# Patient Record
Sex: Female | Born: 1945
Health system: Southern US, Community
[De-identification: ages and names within clinical notes are randomized; demographics above are authoritative.]

## PROBLEM LIST (undated history)

## (undated) DIAGNOSIS — E559 Vitamin D deficiency, unspecified: Secondary | ICD-10-CM

## (undated) DIAGNOSIS — C801 Malignant (primary) neoplasm, unspecified: Secondary | ICD-10-CM

## (undated) DIAGNOSIS — E785 Hyperlipidemia, unspecified: Secondary | ICD-10-CM

## (undated) DIAGNOSIS — M8580 Other specified disorders of bone density and structure, unspecified site: Secondary | ICD-10-CM

## (undated) DIAGNOSIS — K219 Gastro-esophageal reflux disease without esophagitis: Secondary | ICD-10-CM

## (undated) DIAGNOSIS — H269 Unspecified cataract: Secondary | ICD-10-CM

## (undated) DIAGNOSIS — R7989 Other specified abnormal findings of blood chemistry: Secondary | ICD-10-CM

## (undated) DIAGNOSIS — F32A Depression, unspecified: Secondary | ICD-10-CM

## (undated) DIAGNOSIS — K589 Irritable bowel syndrome without diarrhea: Secondary | ICD-10-CM

## (undated) DIAGNOSIS — F419 Anxiety disorder, unspecified: Secondary | ICD-10-CM

## (undated) DIAGNOSIS — T7840XA Allergy, unspecified, initial encounter: Secondary | ICD-10-CM

## (undated) DIAGNOSIS — Z85828 Personal history of other malignant neoplasm of skin: Secondary | ICD-10-CM

## (undated) DIAGNOSIS — M1990 Unspecified osteoarthritis, unspecified site: Secondary | ICD-10-CM

## (undated) DIAGNOSIS — R7301 Impaired fasting glucose: Secondary | ICD-10-CM

## (undated) DIAGNOSIS — F329 Major depressive disorder, single episode, unspecified: Secondary | ICD-10-CM

## (undated) DIAGNOSIS — I471 Supraventricular tachycardia, unspecified: Secondary | ICD-10-CM

## (undated) DIAGNOSIS — K635 Polyp of colon: Secondary | ICD-10-CM

## (undated) DIAGNOSIS — Z9109 Other allergy status, other than to drugs and biological substances: Secondary | ICD-10-CM

## (undated) DIAGNOSIS — R945 Abnormal results of liver function studies: Secondary | ICD-10-CM

## (undated) HISTORY — DX: Unspecified cataract: H26.9

## (undated) HISTORY — DX: Impaired fasting glucose: R73.01

## (undated) HISTORY — DX: Other specified abnormal findings of blood chemistry: R79.89

## (undated) HISTORY — DX: Hyperlipidemia, unspecified: E78.5

## (undated) HISTORY — DX: Supraventricular tachycardia, unspecified: I47.10

## (undated) HISTORY — DX: Polyp of colon: K63.5

## (undated) HISTORY — DX: Vitamin D deficiency, unspecified: E55.9

## (undated) HISTORY — DX: Gastro-esophageal reflux disease without esophagitis: K21.9

## (undated) HISTORY — DX: Depression, unspecified: F32.A

## (undated) HISTORY — DX: Other allergy status, other than to drugs and biological substances: Z91.09

## (undated) HISTORY — DX: Irritable bowel syndrome, unspecified: K58.9

## (undated) HISTORY — PX: MOHS SURGERY: SUR867

## (undated) HISTORY — DX: Personal history of other malignant neoplasm of skin: Z85.828

## (undated) HISTORY — DX: Unspecified osteoarthritis, unspecified site: M19.90

## (undated) HISTORY — DX: Anxiety disorder, unspecified: F41.9

## (undated) HISTORY — DX: Supraventricular tachycardia: I47.1

## (undated) HISTORY — DX: Allergy, unspecified, initial encounter: T78.40XA

## (undated) HISTORY — PX: DILATION AND CURETTAGE OF UTERUS: SHX78

## (undated) HISTORY — DX: Irritable bowel syndrome without diarrhea: K58.9

## (undated) HISTORY — DX: Abnormal results of liver function studies: R94.5

## (undated) HISTORY — DX: Malignant (primary) neoplasm, unspecified: C80.1

## (undated) HISTORY — DX: Other specified disorders of bone density and structure, unspecified site: M85.80

## (undated) HISTORY — DX: Major depressive disorder, single episode, unspecified: F32.9

## (undated) HISTORY — PX: OTHER SURGICAL HISTORY: SHX169

---

## 1998-08-17 ENCOUNTER — Encounter: Payer: Self-pay | Admitting: Obstetrics and Gynecology

## 1998-08-17 ENCOUNTER — Ambulatory Visit (HOSPITAL_COMMUNITY): Admission: RE | Admit: 1998-08-17 | Discharge: 1998-08-17 | Payer: Self-pay | Admitting: Obstetrics and Gynecology

## 1999-06-14 ENCOUNTER — Other Ambulatory Visit: Admission: RE | Admit: 1999-06-14 | Discharge: 1999-06-14 | Payer: Self-pay | Admitting: Obstetrics and Gynecology

## 2000-08-06 ENCOUNTER — Other Ambulatory Visit
Admission: RE | Admit: 2000-08-06 | Discharge: 2000-08-06 | Payer: Self-pay | Admitting: Physical Medicine & Rehabilitation

## 2000-12-17 HISTORY — PX: COLONOSCOPY: SHX174

## 2001-03-04 ENCOUNTER — Encounter: Admission: RE | Admit: 2001-03-04 | Discharge: 2001-03-04 | Payer: Self-pay | Admitting: Obstetrics and Gynecology

## 2001-03-04 ENCOUNTER — Encounter: Payer: Self-pay | Admitting: Obstetrics and Gynecology

## 2001-03-11 ENCOUNTER — Encounter: Payer: Self-pay | Admitting: Obstetrics and Gynecology

## 2001-03-11 ENCOUNTER — Encounter: Admission: RE | Admit: 2001-03-11 | Discharge: 2001-03-11 | Payer: Self-pay | Admitting: Obstetrics and Gynecology

## 2002-08-25 ENCOUNTER — Emergency Department (HOSPITAL_COMMUNITY): Admission: EM | Admit: 2002-08-25 | Discharge: 2002-08-25 | Payer: Self-pay | Admitting: Emergency Medicine

## 2002-10-28 ENCOUNTER — Encounter: Payer: Self-pay | Admitting: Obstetrics and Gynecology

## 2002-10-28 ENCOUNTER — Encounter: Admission: RE | Admit: 2002-10-28 | Discharge: 2002-10-28 | Payer: Self-pay | Admitting: Obstetrics and Gynecology

## 2003-09-15 ENCOUNTER — Encounter: Payer: Self-pay | Admitting: Internal Medicine

## 2003-09-15 ENCOUNTER — Encounter: Admission: RE | Admit: 2003-09-15 | Discharge: 2003-09-15 | Payer: Self-pay | Admitting: Internal Medicine

## 2004-06-07 ENCOUNTER — Encounter: Admission: RE | Admit: 2004-06-07 | Discharge: 2004-06-07 | Payer: Self-pay | Admitting: Obstetrics and Gynecology

## 2004-12-14 ENCOUNTER — Other Ambulatory Visit: Admission: RE | Admit: 2004-12-14 | Discharge: 2004-12-14 | Payer: Self-pay | Admitting: Obstetrics and Gynecology

## 2005-12-19 ENCOUNTER — Other Ambulatory Visit: Admission: RE | Admit: 2005-12-19 | Discharge: 2005-12-19 | Payer: Self-pay | Admitting: Obstetrics and Gynecology

## 2005-12-24 ENCOUNTER — Encounter: Admission: RE | Admit: 2005-12-24 | Discharge: 2005-12-24 | Payer: Self-pay | Admitting: Obstetrics and Gynecology

## 2006-07-24 ENCOUNTER — Ambulatory Visit: Payer: Self-pay | Admitting: Cardiology

## 2006-12-19 ENCOUNTER — Other Ambulatory Visit: Admission: RE | Admit: 2006-12-19 | Discharge: 2006-12-19 | Payer: Self-pay | Admitting: Obstetrics and Gynecology

## 2006-12-26 ENCOUNTER — Encounter: Admission: RE | Admit: 2006-12-26 | Discharge: 2006-12-26 | Payer: Self-pay | Admitting: Obstetrics and Gynecology

## 2007-01-23 ENCOUNTER — Ambulatory Visit: Payer: Self-pay | Admitting: Cardiology

## 2007-03-04 ENCOUNTER — Ambulatory Visit: Admission: RE | Admit: 2007-03-04 | Discharge: 2007-03-04 | Payer: Self-pay | Admitting: Gynecologic Oncology

## 2007-12-29 ENCOUNTER — Encounter: Admission: RE | Admit: 2007-12-29 | Discharge: 2007-12-29 | Payer: Self-pay | Admitting: Obstetrics and Gynecology

## 2008-02-20 ENCOUNTER — Ambulatory Visit: Payer: Self-pay | Admitting: Cardiology

## 2008-12-29 ENCOUNTER — Encounter: Admission: RE | Admit: 2008-12-29 | Discharge: 2008-12-29 | Payer: Self-pay | Admitting: Obstetrics and Gynecology

## 2009-06-27 ENCOUNTER — Other Ambulatory Visit: Admission: RE | Admit: 2009-06-27 | Discharge: 2009-06-27 | Payer: Self-pay | Admitting: Obstetrics and Gynecology

## 2010-02-10 ENCOUNTER — Encounter: Admission: RE | Admit: 2010-02-10 | Discharge: 2010-02-10 | Payer: Self-pay | Admitting: Obstetrics and Gynecology

## 2010-10-25 ENCOUNTER — Other Ambulatory Visit: Admission: RE | Admit: 2010-10-25 | Discharge: 2010-10-25 | Payer: Self-pay | Admitting: Obstetrics and Gynecology

## 2010-12-17 HISTORY — PX: MULTIPLE TOOTH EXTRACTIONS: SHX2053

## 2011-01-06 ENCOUNTER — Other Ambulatory Visit: Payer: Self-pay | Admitting: Internal Medicine

## 2011-01-06 DIAGNOSIS — Z1239 Encounter for other screening for malignant neoplasm of breast: Secondary | ICD-10-CM

## 2011-02-06 ENCOUNTER — Other Ambulatory Visit: Payer: Self-pay | Admitting: Dermatology

## 2011-02-13 ENCOUNTER — Ambulatory Visit
Admission: RE | Admit: 2011-02-13 | Discharge: 2011-02-13 | Disposition: A | Discharge status: Home / Self Care | Payer: BC Managed Care – PPO | Source: Ambulatory Visit | Attending: Internal Medicine | Admitting: Internal Medicine

## 2011-02-13 DIAGNOSIS — Z1239 Encounter for other screening for malignant neoplasm of breast: Secondary | ICD-10-CM

## 2011-05-01 NOTE — Assessment & Plan Note (Signed)
[facility]                            CARDIOLOGY OFFICE NOTE   NAME:Alicia, [name]                          MRN:          [id number]  DATE:02/20/2008                            DOB:          06-27-1946    PRIMARY CARE PHYSICIAN:  Dr. Loraine Leriche Perini  <  Jaquita Rector FOR VISIT/>  Follow up paroxysmal supraventricular tachycardia.   HISTORY OF PRESENT ILLNESS:  [name] comes back in for a 1-year visit.  She is doing very well.  She denies having any problems with  palpitations and continues on verapamil ER.  Her electrocardiogram today  shows sinus rhythm with nonspecific ST changes and a borderline short PR  interval of 132 milliseconds.  She continues to exercise doing yoga and  walking without any major limitations.   ALLERGIES:  No known drug allergies.   MEDICATIONS:  1. Verapamil SR 180 mg p.o. daily.  2. Calcium 1200 mg p.o. daily.  3. Centrum Silver daily.  4. Nexium 20 mg daily.  5. Allergy shots.  6. Crestor 5 mg daily.  7. Aspirin 81 mg every-other day.  8. Paxil 12.5 mg daily.  9. Magnesium supplements.  10.Alavert p.r.n.   REVIEW OF SYSTEMS:  As described in the history of present illness;  otherwise negative.   EXAMINATION:  Blood pressure is 120/71, heart rate is 79, weight is 160  pounds.  The patient is comfortable, normally nourished, no acute  distress.  HEENT:  Conjunctivae, lids normal.  Oropharynx is clear.  NECK:  Supple.  No elevated jugular venous pressure.  LUNGS:  Clear without labored breathing.  CARDIAC EXAM:  Reveals a regular rate and rhythm.  No S3 gallop.  EXTREMITIES:  Show no significant pitting edema.   IMPRESSION AND RECOMMENDATIONS:  Previously documented paroxysmal  supraventricular tachycardia, likely due to a reentrant mechanism.  The  patient has a borderline short PR interval.  She is symptomatically very  well controlled on her present regimen.  I suggested that she continue  regular exercise and  regular follow-up with Dr. Waynard Edwards.  If she  manifests any progressive symptoms we can certainly see her back for  further medication adjustments or perhaps for electrophysiology  referral.     Jonelle Sidle, MD  Electronically Signed    SGM/MedQ  DD: 02/20/2008  DT: 02/22/2008  Job #: [id number]   cc:   Loraine Leriche A. Perini, M.D.

## 2011-05-04 NOTE — Assessment & Plan Note (Signed)
[facility]                            CARDIOLOGY OFFICE NOTE   NAME:[name], [name]                          MRN:          [id number]  DATE:01/23/2007                            DOB:          Apr 07, 1946    PRIMARY CARE PHYSICIAN:  Dr. Rodrigo Ran.   REASON FOR VISIT:  Followup of paroxysmal supraventricular tachycardia.   HISTORY OF PRESENT ILLNESS:  I saw Alicia [name] back in August. She reports  doing well with only a brief episode of palpitations in the interim, but  nothing prolonged or more intense. She continues to do yoga and is not  having any exertional chest pain or dyspnea. Her electrocardiogram today  shows sinus rhythm with some premature ventricular complexes which she  states that she does feel at times and are described as a brief fullness  in her chest. She has had no dizziness or syncope. Her medications are  outlined below and she continues Verapamil SR. She has not had to use  any p.r.n. Lopressor.   ALLERGIES:  No known drug allergies.   MEDICATIONS:  1. Verapamil SR 180 mg daily.  2. Calcium 600 mg 2 tablets daily.  3. Centrum Silver daily.  4. Nexium 20 mg daily.  5. Allergy shots.  6. Crestor 5 mg daily.  7. Metamucil.  8. Aspirin 81 mg q.o.d.  9. Paxil 12.5 mg daily.  10.Magnesium supplements.   REVIEW OF SYSTEMS:  As described in history of present illness.   PHYSICAL EXAMINATION:  VITAL SIGNS: Blood pressure 122/80, heart rate is  70, weight 156 pounds.  GENERAL: The patient is comfortable and in no acute distress.  NECK: Reveals no elevated jugular venous pressure or loud bruits.  LUNGS: Clear without labored breathing.  CARDIAC: Reveals a regular rate and rhythm, without loud murmur or  gallop.  EXTREMITIES: Show no significant pitting edema.  SKIN: Warm and dry.   IMPRESSION/RECOMMENDATIONS:  1. History of previously documented paroxysmal supraventricular      tachycardia, likely a re-entrant mechanism  tachycardia. She likely      has a concealed pathway based on her short PR interval.      Symptomatically, she seems to be well controlled on the present      medical regimen, and at this point we will recommend observation      and yearly followup. Certainly, if      she has any progressive symptoms in the interim we can see her      back.  2. Further plan is to followup.     Jonelle Sidle, MD  Electronically Signed    SGM/MedQ  DD: 01/23/2007  DT: 01/24/2007  Job #: [id number]   cc:   Loraine Leriche A. Perini, M.D.

## 2011-05-04 NOTE — Consult Note (Signed)
NAME:  Alicia [name], Alicia [name]                   ACCOUNT NO.:  [id number]   MEDICAL RECORD NO.:  [id number]          PATIENT TYPE:  OUT   LOCATION:  GYN                          FACILITY:  [facility]   PHYSICIAN:  Alicia A. Duard Brady, MD    DATE OF BIRTH:  1946/01/16   DATE OF CONSULTATION:  03/04/2007  DATE OF DISCHARGE:                                 CONSULTATION   REFERRING PHYSICIAN:  Dr. Artist [name]   The patient is seen today in consultation at the request of Dr. Thomasena [name].  [name] is a 65 year old gravida 2, para 2 who was seen by Dr. Thomasena [name]  for routine GYN care.  At that time she had a small Hallahan area at the  tip of her clitoris that was somewhat hypopigmented.  She was told to  use Estrace cream daily and return for followup.  She was initially seen  by Dr. Thomasena [name] December 19, 2006.  Followup January 20, 2007, continued to  show very unimpressive lesion.  It was irregular but small.  She did not  feel comfortable proceeding with a biopsy and referred her to [place] today.  The patient is otherwise completely asymptomatic.  She denies any  bleeding, itching, pruritus or any other issue.  She has been menopausal  since the age of 66.  She did use vaginal Premarin cream as dictated and  did not have any significant sequelae.  She has been trying to look at  the lesion herself and has not noticed it getting any bigger and  symptomatology has not changed.  She has never had any similar symptoms  before.  She has no history of condyloma or any other STDs.  She denies  any change in bowel or bladder habits.   PAST MEDICAL HISTORY:  1. Gastritis.  2. SVT.  3. Depression.  4. Hypercholesterolemia.  5. Seasonal allergies.   MEDICATIONS:  Nexium, verapamil, Paxil, Crestor, Alavert, allergy shots.   ALLERGIES:  None.   PAST SURGICAL HISTORY:  Skin cancer which was basal cell removal in sun-  exposed areas.   SOCIAL HISTORY:  She denies use of tobacco or alcohol.   HEALTH MAINTENANCE:  She is  up-to-date on her mammograms, with her last  one being in January.  Her Pap smears are up-to-date.  She will be due  for a colonoscopy in about a year.   FAMILY HISTORY:  Her mother had lung cancer but she was a smoker.  Her  father had renal failure, most likely secondary to hypertensive  nephropathy.  Her brother has hypertension.  She has two children, ages  39 and 60.  There are alive and well.   PHYSICAL EXAMINATION:  VITAL SIGNS:  Height 5 feet 6, weight 156 pounds,  blood pressure 138/78, pulse 88, respirations 18.  GENERAL:  Well-nourished, well-developed female in no acute distress.  NECK:  Supple.  There is no lymphadenopathy, no thyromegaly.  LUNGS:  Clear to auscultation bilaterally.  CARDIOVASCULAR:  Regular rate and rhythm.  ABDOMEN:  Soft, nontender, nondistended.  There are no palpable masses  or hepatosplenomegaly.  Groins are negative for adenopathy.  EXTREMITIES:  No edema.  PELVIC:  External genitalia is somewhat atrophic.  At the very tip of  her clitoris she does have a small-wise hypopigmented lesion that  measures approximately a millimeter in size.  It is very smooth.  There  is no hyperemia.  There are no vascular changes.  There are no  rugations.  Options of biopsy versus close followup were discussed with  the patient and she wished to defer biopsy.  The remainder of the vulva  was evaluated.  There are no other visible changes.  Acetic acid was  applied to the lesion and it did not become acetowhite and it stayed its  pale [place] color.   ASSESSMENT:  A 66 year old with a very small clitoral lesion of  uncertain etiology.  I do not think that this represents a malignant  process.  However, of this is a premalignant process I still believe  close followup can ensue.  The patient is very happy with that  recommendation.  I would recommend her following up with Dr. Thomasena [name] in  6 months to ensure stability.  If there are no changes at that time then  I do  not feel that it needs any further workup or evaluation.  If,  however, it appears to be enlarging or the patient herself notices more  symptomatology she was instructed to call myself or Dr. Thomasena [name] sooner.  Her questions were elicited and answered her satisfaction.  She was very  pleased with her consultation today.  She will follow up with Dr.  Thomasena [name].      Alicia A. Duard Brady, MD  Electronically Signed     PAG/MEDQ  D:  03/04/2007  T:  03/05/2007  Job:  [id number]   cc:   Alicia [name], M.D.  Fax: [phone]   Loraine Leriche A. [name], M.D.  Fax: [phone]    Cardiology   Alicia [name], R.N.  [address]  [place], [place] [address]

## 2011-05-04 NOTE — Assessment & Plan Note (Signed)
[facility]                              CARDIOLOGY OFFICE NOTE   NAME:WHITEEdnah, Hammock                          MRN:          [id number]  DATE:07/24/2006                            DOB:          24-Apr-1946    NEW PATIENT VISIT:   PRIMARY CARE PHYSICIAN:  Mark A. Perini, M.D.   REASON FOR VISIT:  Followup supraventricular.   HISTORY OF PRESENT ILLNESS:  Ms. Skipper is a pleasant 65 year old woman with  a history of paroxysmal supraventricular tachycardia, as well as reportedly  mild hyperlipidemia.  She was seen in the office back in 2002 by Dr.  Gerri Spore following an episode of apparent supraventricular tachycardia that  was not documented.  Medical therapy/observation was recommended at that  time following a normal exercise Cardiolite demonstrating no ischemia, as  well as an echocardiogram demonstrating no ischemia, as well as an  echocardiogram demonstrating a normal left ventricular ejection fraction at  55-60% with no major valvular abnormalities.   In terms of symptom control, Ms. Pesantez states that she can think of 3 more  prolonged episodes of rapid palpitations that have occurred over the last 6  years, and otherwise she is not bothered by any particular palpitations,  except only for fleeting symptoms.  Most recently in July, [place] and [place] walking when she developed a  prolonged episode of rapid palpitations that lasted approximately 30 minutes  and ultimately required intravenous adenosine via emergency medical services  with a brief observation in the emergency department.  I do have records  from this visit, as well as telemetry strips from [facility] in [place].  The available tracings  demonstrate a narrow complex tachycardia of approximately 180-200 beats per  minute, as well as some motion artifact, and ultimately demonstration of  normal sinus  rhythm with premature ventricular complexes.  Her resting  electrocardiogram from that same time showed sinus rhythm with a PR interval  of 126 ms, nonspecific ST changes, and no frank delta waves.  The  supraventricular tachycardia was regular, and there was no clear evidence of  atrial fibrillation.  After being treated with adenosine, she was back to  baseline and had her verapamil SR dose increased from 120 mg a day to 180 mg  a day, which she continues to take at this point.  Since that time, she has  had no further symptoms with the same levels of exertion.  She states that  she is quite active and walks regularly without any angina or dyspnea on  exertion.  She does plan to go to [place] over the next few weeks for a  safari.  She has had no frank syncope associated with her symptoms.   ALLERGIES:  No known drug allergies.   PRESENT MEDICATIONS:  1.  Calcium 600 mg two tablets p.o. q.a.m. and q.h.s.  2.  Centrum Silver one p.o. daily.  3.  Paxil 12.5 mg p.o. daily.  4.  Nexium 20 mg p.o. daily.  5.  Allergy shots.  6.  Alavert as directed.  7.  Verapamil SR 180 mg p.o. daily.  8.  Crestor daily (unknown dose).  9.  Supplements of Metamucil b.i.d.  10. Aspirin 81 mg p.o. every other day.   REVIEW OF SYSTEMS:  As described in the history of present illness.  Otherwise, negative.   PHYSICAL EXAMINATION:  VITAL SIGNS:  Blood pressure today is 128/78, heart  rate is 68.  GENERAL:  The patient is normally nourished and in no acute distress.  Denying any active symptoms.  HEENT:  No exophthalmos.  Oropharynx is clear.  NECK:  Supple without elevated jugular venous pressure or loud bruits.  No  thyromegaly or thyroid tenderness is noted.  LUNGS:  Clear without labored breathing at rest.  CARDIAC:  Regular rate and rhythm without S3 gallop or murmur.  No  pericardial rub is noted.  EXTREMITIES:  No significant pitting edema.   IMPRESSION AND RECOMMENDATIONS:  1.  History  of paroxysmal supraventricular tachycardia, likely via a      reentrance mechanism such as AV nodal reentrant tachycardia.  The PR      interval is borderline short on resting electrocardiogram, although      there are no frank delta waves noted on available tracings.  She has      been tolerating verapamil for some time, and has had very few      breakthrough episodes over the last 6 years.  At this point, will plan      to continue the present dose of verapamil SR 180 mg p.o. daily.  I      discussed with her vagal maneuvers, and have also provided a      prescription for Lopressor 25 mg p.o. p.r.n. just in case she has a more      prolonged episode while she is traveling and is unable to seek medical      attention for adenosine.  Otherwise, she will come back over the next 6      months for symptom review, and sooner if she has recurrent problems.  We      did briefly touch on the possibility of ablation if needed down the      road.  2.  Further recommendations to follow.                                Jonelle Sidle, MD    SGM/MedQ  DD:  07/24/2006  DT:  07/24/2006  Job #:  [id number]   cc:   Loraine Leriche A. Perini, MD

## 2011-10-30 ENCOUNTER — Other Ambulatory Visit: Payer: Self-pay | Admitting: Obstetrics and Gynecology

## 2011-10-30 ENCOUNTER — Other Ambulatory Visit (HOSPITAL_COMMUNITY)
Admission: RE | Admit: 2011-10-30 | Discharge: 2011-10-30 | Disposition: A | Discharge status: Home / Self Care | Payer: Medicare Other | Source: Ambulatory Visit | Attending: Obstetrics and Gynecology | Admitting: Obstetrics and Gynecology

## 2011-10-30 DIAGNOSIS — Z01419 Encounter for gynecological examination (general) (routine) without abnormal findings: Secondary | ICD-10-CM | POA: Insufficient documentation

## 2011-12-24 DIAGNOSIS — J309 Allergic rhinitis, unspecified: Secondary | ICD-10-CM | POA: Diagnosis not present

## 2011-12-25 DIAGNOSIS — M949 Disorder of cartilage, unspecified: Secondary | ICD-10-CM | POA: Diagnosis not present

## 2011-12-25 DIAGNOSIS — M899 Disorder of bone, unspecified: Secondary | ICD-10-CM | POA: Diagnosis not present

## 2012-01-01 DIAGNOSIS — J309 Allergic rhinitis, unspecified: Secondary | ICD-10-CM | POA: Diagnosis not present

## 2012-01-15 DIAGNOSIS — L570 Actinic keratosis: Secondary | ICD-10-CM | POA: Diagnosis not present

## 2012-01-29 DIAGNOSIS — J309 Allergic rhinitis, unspecified: Secondary | ICD-10-CM | POA: Diagnosis not present

## 2012-02-11 DIAGNOSIS — J309 Allergic rhinitis, unspecified: Secondary | ICD-10-CM | POA: Diagnosis not present

## 2012-02-18 ENCOUNTER — Other Ambulatory Visit: Payer: Self-pay | Admitting: Internal Medicine

## 2012-02-18 DIAGNOSIS — Z1231 Encounter for screening mammogram for malignant neoplasm of breast: Secondary | ICD-10-CM

## 2012-02-21 DIAGNOSIS — J309 Allergic rhinitis, unspecified: Secondary | ICD-10-CM | POA: Diagnosis not present

## 2012-02-22 DIAGNOSIS — E785 Hyperlipidemia, unspecified: Secondary | ICD-10-CM | POA: Diagnosis not present

## 2012-02-22 DIAGNOSIS — R7301 Impaired fasting glucose: Secondary | ICD-10-CM | POA: Diagnosis not present

## 2012-02-22 DIAGNOSIS — M949 Disorder of cartilage, unspecified: Secondary | ICD-10-CM | POA: Diagnosis not present

## 2012-02-22 DIAGNOSIS — M899 Disorder of bone, unspecified: Secondary | ICD-10-CM | POA: Diagnosis not present

## 2012-02-22 DIAGNOSIS — E559 Vitamin D deficiency, unspecified: Secondary | ICD-10-CM | POA: Diagnosis not present

## 2012-02-22 DIAGNOSIS — R82998 Other abnormal findings in urine: Secondary | ICD-10-CM | POA: Diagnosis not present

## 2012-02-26 ENCOUNTER — Ambulatory Visit: Payer: Medicare Other

## 2012-02-29 ENCOUNTER — Encounter: Payer: Self-pay | Admitting: Gastroenterology

## 2012-02-29 DIAGNOSIS — Z0000 Encounter for general adult medical examination without abnormal findings: Secondary | ICD-10-CM | POA: Diagnosis not present

## 2012-02-29 DIAGNOSIS — E785 Hyperlipidemia, unspecified: Secondary | ICD-10-CM | POA: Diagnosis not present

## 2012-02-29 DIAGNOSIS — R7301 Impaired fasting glucose: Secondary | ICD-10-CM | POA: Diagnosis not present

## 2012-02-29 DIAGNOSIS — I471 Supraventricular tachycardia: Secondary | ICD-10-CM | POA: Diagnosis not present

## 2012-03-04 DIAGNOSIS — Z1212 Encounter for screening for malignant neoplasm of rectum: Secondary | ICD-10-CM | POA: Diagnosis not present

## 2012-03-06 DIAGNOSIS — J309 Allergic rhinitis, unspecified: Secondary | ICD-10-CM | POA: Diagnosis not present

## 2012-03-13 ENCOUNTER — Ambulatory Visit
Admission: RE | Admit: 2012-03-13 | Discharge: 2012-03-13 | Disposition: A | Discharge status: Home / Self Care | Payer: Medicare Other | Source: Ambulatory Visit | Attending: Internal Medicine | Admitting: Internal Medicine

## 2012-03-13 DIAGNOSIS — J309 Allergic rhinitis, unspecified: Secondary | ICD-10-CM | POA: Diagnosis not present

## 2012-03-13 DIAGNOSIS — Z1231 Encounter for screening mammogram for malignant neoplasm of breast: Secondary | ICD-10-CM

## 2012-03-27 DIAGNOSIS — J309 Allergic rhinitis, unspecified: Secondary | ICD-10-CM | POA: Diagnosis not present

## 2012-04-02 ENCOUNTER — Ambulatory Visit (AMBULATORY_SURGERY_CENTER): Payer: Medicare Other | Admitting: *Deleted

## 2012-04-02 VITALS — Ht 66.0 in | Wt 166.7 lb

## 2012-04-02 DIAGNOSIS — Z1211 Encounter for screening for malignant neoplasm of colon: Secondary | ICD-10-CM

## 2012-04-02 MED ORDER — PEG-KCL-NACL-NASULF-NA ASC-C 100 G PO SOLR: ORAL | Status: DC

## 2012-04-03 DIAGNOSIS — J309 Allergic rhinitis, unspecified: Secondary | ICD-10-CM | POA: Diagnosis not present

## 2012-04-03 DIAGNOSIS — D239 Other benign neoplasm of skin, unspecified: Secondary | ICD-10-CM | POA: Diagnosis not present

## 2012-04-03 DIAGNOSIS — Z85828 Personal history of other malignant neoplasm of skin: Secondary | ICD-10-CM | POA: Diagnosis not present

## 2012-04-03 DIAGNOSIS — L570 Actinic keratosis: Secondary | ICD-10-CM | POA: Diagnosis not present

## 2012-04-03 DIAGNOSIS — D485 Neoplasm of uncertain behavior of skin: Secondary | ICD-10-CM | POA: Diagnosis not present

## 2012-04-03 DIAGNOSIS — L821 Other seborrheic keratosis: Secondary | ICD-10-CM | POA: Diagnosis not present

## 2012-04-16 ENCOUNTER — Encounter: Payer: Self-pay | Admitting: Gastroenterology

## 2012-04-16 ENCOUNTER — Ambulatory Visit (AMBULATORY_SURGERY_CENTER): Payer: Medicare Other | Admitting: Gastroenterology

## 2012-04-16 VITALS — BP 144/88 | HR 66 | Temp 96.0°F | Resp 18 | Ht 66.0 in | Wt 166.0 lb

## 2012-04-16 DIAGNOSIS — K5730 Diverticulosis of large intestine without perforation or abscess without bleeding: Secondary | ICD-10-CM

## 2012-04-16 DIAGNOSIS — D126 Benign neoplasm of colon, unspecified: Secondary | ICD-10-CM

## 2012-04-16 DIAGNOSIS — Z1211 Encounter for screening for malignant neoplasm of colon: Secondary | ICD-10-CM | POA: Diagnosis not present

## 2012-04-16 MED ORDER — SODIUM CHLORIDE 0.9 % IV SOLN: 500.0000 mL | INTRAVENOUS | Status: DC

## 2012-04-16 NOTE — Progress Notes (Signed)
PROPOFOL PER M SMITH CRNA. SEE SCANNED INTRA PROCEDURE REPORT. EWM 

## 2012-04-16 NOTE — Op Note (Signed)
 [facility] [address] [place], [place]  [address]  COLONOSCOPY PROCEDURE REPORT  PATIENT:  Alicia, [name]  MR#:  [id number] BIRTHDATE:  Mar 21, 1946, 65 yrs. old  GENDER:  female ENDOSCOPIST:  Vania Rea. Jarold Motto, MD, [facility] REF. BY: PROCEDURE DATE:  04/16/2012 PROCEDURE:  Colonoscopy with snare polypectomy ASA CLASS:  Class II INDICATIONS:  Routine Risk Screening MEDICATIONS:   propofol (Diprivan) 250 mg IV  DESCRIPTION OF PROCEDURE:   After the risks and benefits and of the procedure were explained, informed consent was obtained. Digital rectal exam was performed and revealed perianal skin tags. The LB [id number] endoscope was introduced through the anus and advanced to the cecum, which was identified by both the appendix and ileocecal valve.  The quality of the prep was good, using MoviPrep.  The instrument was then slowly withdrawn as the colon was fully examined. <<PROCEDUREIMAGES>>  FINDINGS:  ENDOSCOPIC FINDINGS:  A sessile polyp was found in the ascending colon. 2 mm flat right colon polypsnare removed.jar #1. Two polyps were found in the descending colon. small 2-3 mm polyps also hot snare removed in left colo.  A pedunculated polyp was found in the sigmoid colon. large vascular stalked sigmoid polyp removed.jar #2///see pictures.  There were mild diverticular changes in left colon. diverticulosis was found.   Retroflexed views in the rectum revealed no abnormalities.    The scope was then withdrawn from the patient and the procedure completed.  COMPLICATIONS:  None ENDOSCOPIC IMPRESSION: 1) Sessile polyp in the ascending colon 2) Two polyps in the descending colon 3) Pedunculated polyp in the sigmoid colon 4) Diverticulosis,mild,left sided diverticulosis r/o atypia in adenomas.INCREASED RISK FOR COLON CANCER.NEEDS CLOSE F/U. RECOMMENDATIONS: 1) Await pathology results 2) Continue current medications 3) Repeat Colonoscopy in 3 years.  REPEAT EXAM:   No  ______________________________ Vania Rea. Jarold Motto, MD, [facility]  CC:  Rodrigo Ran, MD  n. Rosalie DoctorMarland Kitchen   Vania Rea. Kaedin Hicklin at 04/16/2012 09:37 AM  Clarita Crane, [id number]

## 2012-04-16 NOTE — Progress Notes (Signed)
Patient did not experience any of the following events: a burn prior to discharge; a fall within the facility; wrong site/side/patient/procedure/implant event; or a hospital transfer or hospital admission upon discharge from the facility. (G8907) Patient did not have preoperative order for IV antibiotic SSI prophylaxis. (G8918)  

## 2012-04-16 NOTE — Patient Instructions (Signed)

## 2012-04-17 ENCOUNTER — Telehealth: Payer: Self-pay | Admitting: *Deleted

## 2012-04-17 NOTE — Telephone Encounter (Signed)
  Follow up Call-  Call back number 04/16/2012  Post procedure Call Back phone  # [phone]  Permission to leave phone message Yes     Patient questions:  Do you have a fever, pain , or abdominal swelling? no Pain Score  0 *  Have you tolerated food without any problems? yes  Have you been able to return to your normal activities? yes  Do you have any questions about your discharge instructions: Diet   no Medications  no Follow up visit  no  Do you have questions or concerns about your Care? no  Actions: * If pain score is 4 or above: No action needed, pain <4.

## 2012-04-21 ENCOUNTER — Encounter: Payer: Self-pay | Admitting: Gastroenterology

## 2012-04-24 DIAGNOSIS — J309 Allergic rhinitis, unspecified: Secondary | ICD-10-CM | POA: Diagnosis not present

## 2012-04-25 DIAGNOSIS — J309 Allergic rhinitis, unspecified: Secondary | ICD-10-CM | POA: Diagnosis not present

## 2012-05-08 DIAGNOSIS — J309 Allergic rhinitis, unspecified: Secondary | ICD-10-CM | POA: Diagnosis not present

## 2012-05-15 DIAGNOSIS — J309 Allergic rhinitis, unspecified: Secondary | ICD-10-CM | POA: Diagnosis not present

## 2012-06-12 DIAGNOSIS — J309 Allergic rhinitis, unspecified: Secondary | ICD-10-CM | POA: Diagnosis not present

## 2012-07-14 DIAGNOSIS — J309 Allergic rhinitis, unspecified: Secondary | ICD-10-CM | POA: Diagnosis not present

## 2012-07-23 DIAGNOSIS — J309 Allergic rhinitis, unspecified: Secondary | ICD-10-CM | POA: Diagnosis not present

## 2012-07-24 DIAGNOSIS — J309 Allergic rhinitis, unspecified: Secondary | ICD-10-CM | POA: Diagnosis not present

## 2012-09-03 DIAGNOSIS — J309 Allergic rhinitis, unspecified: Secondary | ICD-10-CM | POA: Diagnosis not present

## 2012-09-10 DIAGNOSIS — F411 Generalized anxiety disorder: Secondary | ICD-10-CM | POA: Diagnosis not present

## 2012-09-10 DIAGNOSIS — F3342 Major depressive disorder, recurrent, in full remission: Secondary | ICD-10-CM | POA: Diagnosis not present

## 2012-09-11 DIAGNOSIS — J309 Allergic rhinitis, unspecified: Secondary | ICD-10-CM | POA: Diagnosis not present

## 2012-09-15 DIAGNOSIS — J309 Allergic rhinitis, unspecified: Secondary | ICD-10-CM | POA: Diagnosis not present

## 2012-09-22 DIAGNOSIS — J309 Allergic rhinitis, unspecified: Secondary | ICD-10-CM | POA: Diagnosis not present

## 2012-09-26 DIAGNOSIS — J309 Allergic rhinitis, unspecified: Secondary | ICD-10-CM | POA: Diagnosis not present

## 2012-10-10 DIAGNOSIS — J309 Allergic rhinitis, unspecified: Secondary | ICD-10-CM | POA: Diagnosis not present

## 2012-10-23 DIAGNOSIS — J309 Allergic rhinitis, unspecified: Secondary | ICD-10-CM | POA: Diagnosis not present

## 2012-10-31 DIAGNOSIS — Z23 Encounter for immunization: Secondary | ICD-10-CM | POA: Diagnosis not present

## 2012-11-06 DIAGNOSIS — C44319 Basal cell carcinoma of skin of other parts of face: Secondary | ICD-10-CM | POA: Diagnosis not present

## 2012-11-06 DIAGNOSIS — J309 Allergic rhinitis, unspecified: Secondary | ICD-10-CM | POA: Diagnosis not present

## 2012-11-06 DIAGNOSIS — Z85828 Personal history of other malignant neoplasm of skin: Secondary | ICD-10-CM | POA: Diagnosis not present

## 2012-11-06 DIAGNOSIS — D485 Neoplasm of uncertain behavior of skin: Secondary | ICD-10-CM | POA: Diagnosis not present

## 2012-11-06 DIAGNOSIS — L821 Other seborrheic keratosis: Secondary | ICD-10-CM | POA: Diagnosis not present

## 2012-11-06 DIAGNOSIS — D239 Other benign neoplasm of skin, unspecified: Secondary | ICD-10-CM | POA: Diagnosis not present

## 2012-11-06 DIAGNOSIS — L570 Actinic keratosis: Secondary | ICD-10-CM | POA: Diagnosis not present

## 2012-11-18 DIAGNOSIS — J3081 Allergic rhinitis due to animal (cat) (dog) hair and dander: Secondary | ICD-10-CM | POA: Diagnosis not present

## 2012-11-18 DIAGNOSIS — J309 Allergic rhinitis, unspecified: Secondary | ICD-10-CM | POA: Diagnosis not present

## 2012-11-18 DIAGNOSIS — J301 Allergic rhinitis due to pollen: Secondary | ICD-10-CM | POA: Diagnosis not present

## 2012-11-18 DIAGNOSIS — J3089 Other allergic rhinitis: Secondary | ICD-10-CM | POA: Diagnosis not present

## 2012-11-18 DIAGNOSIS — K219 Gastro-esophageal reflux disease without esophagitis: Secondary | ICD-10-CM | POA: Diagnosis not present

## 2012-11-20 DIAGNOSIS — H2510 Age-related nuclear cataract, unspecified eye: Secondary | ICD-10-CM | POA: Diagnosis not present

## 2012-11-20 DIAGNOSIS — H40019 Open angle with borderline findings, low risk, unspecified eye: Secondary | ICD-10-CM | POA: Diagnosis not present

## 2012-11-20 DIAGNOSIS — H52209 Unspecified astigmatism, unspecified eye: Secondary | ICD-10-CM | POA: Diagnosis not present

## 2012-11-20 DIAGNOSIS — H25019 Cortical age-related cataract, unspecified eye: Secondary | ICD-10-CM | POA: Diagnosis not present

## 2012-11-25 DIAGNOSIS — J309 Allergic rhinitis, unspecified: Secondary | ICD-10-CM | POA: Diagnosis not present

## 2012-12-02 DIAGNOSIS — J309 Allergic rhinitis, unspecified: Secondary | ICD-10-CM | POA: Diagnosis not present

## 2012-12-22 DIAGNOSIS — C44319 Basal cell carcinoma of skin of other parts of face: Secondary | ICD-10-CM | POA: Diagnosis not present

## 2013-01-08 DIAGNOSIS — J309 Allergic rhinitis, unspecified: Secondary | ICD-10-CM | POA: Diagnosis not present

## 2013-01-16 DIAGNOSIS — J309 Allergic rhinitis, unspecified: Secondary | ICD-10-CM | POA: Diagnosis not present

## 2013-02-11 ENCOUNTER — Other Ambulatory Visit: Payer: Self-pay

## 2013-02-24 DIAGNOSIS — J309 Allergic rhinitis, unspecified: Secondary | ICD-10-CM | POA: Diagnosis not present

## 2013-03-04 ENCOUNTER — Encounter: Payer: Self-pay | Admitting: Gastroenterology

## 2013-03-05 DIAGNOSIS — J309 Allergic rhinitis, unspecified: Secondary | ICD-10-CM | POA: Diagnosis not present

## 2013-03-06 ENCOUNTER — Encounter: Payer: Self-pay | Admitting: Gastroenterology

## 2013-03-12 DIAGNOSIS — J309 Allergic rhinitis, unspecified: Secondary | ICD-10-CM | POA: Diagnosis not present

## 2013-03-23 ENCOUNTER — Ambulatory Visit
Admission: RE | Admit: 2013-03-23 | Discharge: 2013-03-23 | Disposition: A | Discharge status: Home / Self Care | Payer: Medicare Other | Source: Ambulatory Visit

## 2013-03-23 DIAGNOSIS — Z1231 Encounter for screening mammogram for malignant neoplasm of breast: Secondary | ICD-10-CM

## 2013-03-25 DIAGNOSIS — J309 Allergic rhinitis, unspecified: Secondary | ICD-10-CM | POA: Diagnosis not present

## 2013-04-09 DIAGNOSIS — J309 Allergic rhinitis, unspecified: Secondary | ICD-10-CM | POA: Diagnosis not present

## 2013-04-16 DIAGNOSIS — J309 Allergic rhinitis, unspecified: Secondary | ICD-10-CM | POA: Diagnosis not present

## 2013-04-17 DIAGNOSIS — J309 Allergic rhinitis, unspecified: Secondary | ICD-10-CM | POA: Diagnosis not present

## 2013-05-08 DIAGNOSIS — J309 Allergic rhinitis, unspecified: Secondary | ICD-10-CM | POA: Diagnosis not present

## 2013-05-12 ENCOUNTER — Other Ambulatory Visit: Payer: Self-pay | Admitting: Dermatology

## 2013-05-12 ENCOUNTER — Ambulatory Visit (AMBULATORY_SURGERY_CENTER): Payer: Medicare Other | Admitting: *Deleted

## 2013-05-12 VITALS — Ht 66.5 in | Wt 168.6 lb

## 2013-05-12 DIAGNOSIS — L820 Inflamed seborrheic keratosis: Secondary | ICD-10-CM | POA: Diagnosis not present

## 2013-05-12 DIAGNOSIS — D485 Neoplasm of uncertain behavior of skin: Secondary | ICD-10-CM | POA: Diagnosis not present

## 2013-05-12 DIAGNOSIS — Z85828 Personal history of other malignant neoplasm of skin: Secondary | ICD-10-CM | POA: Diagnosis not present

## 2013-05-12 DIAGNOSIS — D239 Other benign neoplasm of skin, unspecified: Secondary | ICD-10-CM | POA: Diagnosis not present

## 2013-05-12 DIAGNOSIS — C44621 Squamous cell carcinoma of skin of unspecified upper limb, including shoulder: Secondary | ICD-10-CM | POA: Diagnosis not present

## 2013-05-12 DIAGNOSIS — Z1211 Encounter for screening for malignant neoplasm of colon: Secondary | ICD-10-CM

## 2013-05-12 DIAGNOSIS — E785 Hyperlipidemia, unspecified: Secondary | ICD-10-CM | POA: Diagnosis not present

## 2013-05-12 DIAGNOSIS — M949 Disorder of cartilage, unspecified: Secondary | ICD-10-CM | POA: Diagnosis not present

## 2013-05-12 DIAGNOSIS — L821 Other seborrheic keratosis: Secondary | ICD-10-CM | POA: Diagnosis not present

## 2013-05-12 DIAGNOSIS — R7301 Impaired fasting glucose: Secondary | ICD-10-CM | POA: Diagnosis not present

## 2013-05-12 DIAGNOSIS — M899 Disorder of bone, unspecified: Secondary | ICD-10-CM | POA: Diagnosis not present

## 2013-05-12 MED ORDER — MOVIPREP 100 G PO SOLR: ORAL | Status: DC

## 2013-05-14 DIAGNOSIS — F341 Dysthymic disorder: Secondary | ICD-10-CM | POA: Diagnosis not present

## 2013-05-14 DIAGNOSIS — E785 Hyperlipidemia, unspecified: Secondary | ICD-10-CM | POA: Diagnosis not present

## 2013-05-14 DIAGNOSIS — Z0000 Encounter for general adult medical examination without abnormal findings: Secondary | ICD-10-CM | POA: Diagnosis not present

## 2013-05-14 DIAGNOSIS — K219 Gastro-esophageal reflux disease without esophagitis: Secondary | ICD-10-CM | POA: Diagnosis not present

## 2013-05-14 DIAGNOSIS — Z23 Encounter for immunization: Secondary | ICD-10-CM | POA: Diagnosis not present

## 2013-05-14 DIAGNOSIS — J301 Allergic rhinitis due to pollen: Secondary | ICD-10-CM | POA: Diagnosis not present

## 2013-05-14 DIAGNOSIS — Z124 Encounter for screening for malignant neoplasm of cervix: Secondary | ICD-10-CM | POA: Diagnosis not present

## 2013-05-14 DIAGNOSIS — R7301 Impaired fasting glucose: Secondary | ICD-10-CM | POA: Diagnosis not present

## 2013-05-14 DIAGNOSIS — I471 Supraventricular tachycardia: Secondary | ICD-10-CM | POA: Diagnosis not present

## 2013-05-14 DIAGNOSIS — M949 Disorder of cartilage, unspecified: Secondary | ICD-10-CM | POA: Diagnosis not present

## 2013-05-14 DIAGNOSIS — Z1331 Encounter for screening for depression: Secondary | ICD-10-CM | POA: Diagnosis not present

## 2013-05-14 DIAGNOSIS — M899 Disorder of bone, unspecified: Secondary | ICD-10-CM | POA: Diagnosis not present

## 2013-05-18 ENCOUNTER — Encounter: Payer: Self-pay | Admitting: Gastroenterology

## 2013-05-18 ENCOUNTER — Ambulatory Visit (AMBULATORY_SURGERY_CENTER): Payer: Medicare Other | Admitting: Gastroenterology

## 2013-05-18 VITALS — BP 132/78 | HR 60 | Temp 97.8°F | Resp 12 | Ht 66.0 in | Wt 168.0 lb

## 2013-05-18 DIAGNOSIS — Z1211 Encounter for screening for malignant neoplasm of colon: Secondary | ICD-10-CM

## 2013-05-18 DIAGNOSIS — Z86010 Personal history of colonic polyps: Secondary | ICD-10-CM

## 2013-05-18 DIAGNOSIS — D126 Benign neoplasm of colon, unspecified: Secondary | ICD-10-CM

## 2013-05-18 DIAGNOSIS — K5730 Diverticulosis of large intestine without perforation or abscess without bleeding: Secondary | ICD-10-CM | POA: Diagnosis not present

## 2013-05-18 DIAGNOSIS — F959 Tic disorder, unspecified: Secondary | ICD-10-CM | POA: Diagnosis not present

## 2013-05-18 DIAGNOSIS — E785 Hyperlipidemia, unspecified: Secondary | ICD-10-CM | POA: Diagnosis not present

## 2013-05-18 MED ORDER — SODIUM CHLORIDE 0.9 % IV SOLN: 500.0000 mL | INTRAVENOUS | Status: DC

## 2013-05-18 NOTE — Op Note (Signed)
[facility] [address] [place], [address]   COLONOSCOPY PROCEDURE REPORT  PATIENT: Alicia [name], Alicia [name]  MR#: [id number] BIRTHDATE: 12-29-1945 , 66  yrs. old GENDER: Female ENDOSCOPIST: Mardella Layman, MD, [facility] REFERRED BY: PROCEDURE DATE:  05/18/2013 PROCEDURE:   Colonoscopy with snare polypectomy ASA CLASS:   Class I INDICATIONS:Patient's personal history of adenomatous colon polyps.  MEDICATIONS: propofol (Diprivan) 400mg  IV  DESCRIPTION OF PROCEDURE:   After the risks and benefits and of the procedure were explained, informed consent was obtained.  A digital rectal exam revealed no abnormalities of the rectum.    The LB [id number]  endoscope was introduced through the anus and advanced to the cecum, which was identified by both the appendix and ileocecal valve .  The quality of the prep was excellent, using MoviPrep .  The instrument was then slowly withdrawn as the colon was fully examined.     COLON FINDINGS: A polypoid shaped sessile polyp ranging between 3-40mm in size was found at the splenic flexure.  A polypectomy was performed using snare cautery.  The resection was complete and the polyp tissue was completely retrieved.   Mild diverticulosis was noted in the descending colon and sigmoid colon.     Retroflexed views revealed no abnormalities.     The scope was then withdrawn from the patient and the procedure completed.  COMPLICATIONS: There were no complications. ENDOSCOPIC IMPRESSION: 1.   Sessile polyp ranging between 3-44mm in size was found at the splenic flexure; polypectomy was performed using snare cautery 2.   Mild diverticulosis was noted in the descending colon and sigmoid colon  RECOMMENDATIONS: 1.  Repeat Colonoscopy in 3 years. 2.  Await biopsy results 3.  Continue current medications   REPEAT EXAM:  UX:LKGM Perini, MD  _______________________________ eSigned:  Mardella Layman, MD, [facility] 05/18/2013 10:26  AM

## 2013-05-18 NOTE — Progress Notes (Signed)
Patient did not experience any of the following events: a burn prior to discharge; a fall within the facility; wrong site/side/patient/procedure/implant event; or a hospital transfer or hospital admission upon discharge from the facility. (G8907) Patient did not have preoperative order for IV antibiotic SSI prophylaxis. (G8918)  

## 2013-05-18 NOTE — Patient Instructions (Addendum)
Discharge instructions given with verbal understanding. Handout on polyps and diverticulosis given. Resume previous medications. YOU HAD AN ENDOSCOPIC PROCEDURE TODAY AT THE [facility]: Refer to the procedure report that was given to you for any specific questions about what was found during the examination.  If the procedure report does not answer your questions, please call your gastroenterologist to clarify.  If you requested that your care partner not be given the details of your procedure findings, then the procedure report has been included in a sealed envelope for you to review at your convenience later.  YOU SHOULD EXPECT: Some feelings of bloating in the abdomen. Passage of more gas than usual.  Walking can help get rid of the air that was put into your GI tract during the procedure and reduce the bloating. If you had a lower endoscopy (such as a colonoscopy or flexible sigmoidoscopy) you may notice spotting of blood in your stool or on the toilet paper. If you underwent a bowel prep for your procedure, then you may not have a normal bowel movement for a few days.  DIET: Your first meal following the procedure should be a light meal and then it is ok to progress to your normal diet.  A half-sandwich or bowl of soup is an example of a good first meal.  Heavy or fried foods are harder to digest and may make you feel nauseous or bloated.  Likewise meals heavy in dairy and vegetables can cause extra gas to form and this can also increase the bloating.  Drink plenty of fluids but you should avoid alcoholic beverages for 24 hours.  ACTIVITY: Your care partner should take you home directly after the procedure.  You should plan to take it easy, moving slowly for the rest of the day.  You can resume normal activity the day after the procedure however you should NOT DRIVE or use heavy machinery for 24 hours (because of the sedation medicines used during the test).    SYMPTOMS TO REPORT  IMMEDIATELY: A gastroenterologist can be reached at any hour.  During normal business hours, 8:30 AM to 5:00 PM Monday through Friday, call [phone].  After hours and on weekends, please call the GI answering service at [phone] who will take a message and have the physician on call contact you.   Following lower endoscopy (colonoscopy or flexible sigmoidoscopy):  Excessive amounts of blood in the stool  Significant tenderness or worsening of abdominal pains  Swelling of the abdomen that is new, acute  Fever of 100F or higher  FOLLOW UP: If any biopsies were taken you will be contacted by phone or by letter within the next 1-3 weeks.  Call your gastroenterologist if you have not heard about the biopsies in 3 weeks.  Our staff will call the home number listed on your records the next business day following your procedure to check on you and address any questions or concerns that you may have at that time regarding the information given to you following your procedure. This is a courtesy call and so if there is no answer at the home number and we have not heard from you through the emergency physician on call, we will assume that you have returned to your regular daily activities without incident.  SIGNATURES/CONFIDENTIALITY: You and/or your care partner have signed paperwork which will be entered into your electronic medical record.  These signatures attest to the fact that that the information above on your After Visit   Summary has been reviewed and is understood.  Full responsibility of the confidentiality of this discharge information lies with you and/or your care-partner. 

## 2013-05-18 NOTE — Progress Notes (Signed)
Called to room to assist during endoscopic procedure.  Patient ID and intended procedure confirmed with present staff. Received instructions for my participation in the procedure from the performing physician.  

## 2013-05-19 ENCOUNTER — Telehealth: Payer: Self-pay

## 2013-05-19 DIAGNOSIS — J309 Allergic rhinitis, unspecified: Secondary | ICD-10-CM | POA: Diagnosis not present

## 2013-05-19 DIAGNOSIS — M899 Disorder of bone, unspecified: Secondary | ICD-10-CM | POA: Diagnosis not present

## 2013-05-19 NOTE — Telephone Encounter (Signed)
  Follow up Call-  Call back number 05/18/2013 04/16/2012  Post procedure Call Back phone  # [phone]  Permission to leave phone message Yes Yes     Patient questions:  Do you have a fever, pain , or abdominal swelling? no Pain Score  0 *  Have you tolerated food without any problems? yes  Have you been able to return to your normal activities? yes  Do you have any questions about your discharge instructions: Diet   no Medications  no Follow up visit  no  Do you have questions or concerns about your Care? no  Actions: * If pain score is 4 or above: No action needed, pain <4.

## 2013-05-25 ENCOUNTER — Encounter: Payer: Self-pay | Admitting: Gastroenterology

## 2013-05-25 DIAGNOSIS — J309 Allergic rhinitis, unspecified: Secondary | ICD-10-CM | POA: Diagnosis not present

## 2013-05-28 DIAGNOSIS — J309 Allergic rhinitis, unspecified: Secondary | ICD-10-CM | POA: Diagnosis not present

## 2013-06-11 DIAGNOSIS — J309 Allergic rhinitis, unspecified: Secondary | ICD-10-CM | POA: Diagnosis not present

## 2013-06-12 ENCOUNTER — Other Ambulatory Visit: Payer: Self-pay | Admitting: Dermatology

## 2013-06-12 DIAGNOSIS — C44621 Squamous cell carcinoma of skin of unspecified upper limb, including shoulder: Secondary | ICD-10-CM | POA: Diagnosis not present

## 2013-06-17 DIAGNOSIS — J309 Allergic rhinitis, unspecified: Secondary | ICD-10-CM | POA: Diagnosis not present

## 2013-06-23 DIAGNOSIS — J309 Allergic rhinitis, unspecified: Secondary | ICD-10-CM | POA: Diagnosis not present

## 2013-07-08 DIAGNOSIS — J309 Allergic rhinitis, unspecified: Secondary | ICD-10-CM | POA: Diagnosis not present

## 2013-08-18 DIAGNOSIS — J309 Allergic rhinitis, unspecified: Secondary | ICD-10-CM | POA: Diagnosis not present

## 2013-08-21 DIAGNOSIS — J309 Allergic rhinitis, unspecified: Secondary | ICD-10-CM | POA: Diagnosis not present

## 2013-09-01 DIAGNOSIS — J309 Allergic rhinitis, unspecified: Secondary | ICD-10-CM | POA: Diagnosis not present

## 2013-09-08 DIAGNOSIS — F3342 Major depressive disorder, recurrent, in full remission: Secondary | ICD-10-CM | POA: Diagnosis not present

## 2013-09-08 DIAGNOSIS — J309 Allergic rhinitis, unspecified: Secondary | ICD-10-CM | POA: Diagnosis not present

## 2013-09-08 DIAGNOSIS — F411 Generalized anxiety disorder: Secondary | ICD-10-CM | POA: Diagnosis not present

## 2013-09-11 DIAGNOSIS — J309 Allergic rhinitis, unspecified: Secondary | ICD-10-CM | POA: Diagnosis not present

## 2013-09-16 DIAGNOSIS — J309 Allergic rhinitis, unspecified: Secondary | ICD-10-CM | POA: Diagnosis not present

## 2013-09-18 DIAGNOSIS — J309 Allergic rhinitis, unspecified: Secondary | ICD-10-CM | POA: Diagnosis not present

## 2013-09-24 DIAGNOSIS — J309 Allergic rhinitis, unspecified: Secondary | ICD-10-CM | POA: Diagnosis not present

## 2013-10-06 DIAGNOSIS — J309 Allergic rhinitis, unspecified: Secondary | ICD-10-CM | POA: Diagnosis not present

## 2013-10-08 DIAGNOSIS — Z23 Encounter for immunization: Secondary | ICD-10-CM | POA: Diagnosis not present

## 2013-10-20 DIAGNOSIS — J309 Allergic rhinitis, unspecified: Secondary | ICD-10-CM | POA: Diagnosis not present

## 2013-11-03 DIAGNOSIS — J309 Allergic rhinitis, unspecified: Secondary | ICD-10-CM | POA: Diagnosis not present

## 2013-11-26 DIAGNOSIS — J309 Allergic rhinitis, unspecified: Secondary | ICD-10-CM | POA: Diagnosis not present

## 2013-12-01 DIAGNOSIS — L821 Other seborrheic keratosis: Secondary | ICD-10-CM | POA: Diagnosis not present

## 2013-12-01 DIAGNOSIS — Z85828 Personal history of other malignant neoplasm of skin: Secondary | ICD-10-CM | POA: Diagnosis not present

## 2013-12-01 DIAGNOSIS — L570 Actinic keratosis: Secondary | ICD-10-CM | POA: Diagnosis not present

## 2013-12-01 DIAGNOSIS — D239 Other benign neoplasm of skin, unspecified: Secondary | ICD-10-CM | POA: Diagnosis not present

## 2013-12-02 DIAGNOSIS — K219 Gastro-esophageal reflux disease without esophagitis: Secondary | ICD-10-CM | POA: Diagnosis not present

## 2013-12-02 DIAGNOSIS — J301 Allergic rhinitis due to pollen: Secondary | ICD-10-CM | POA: Diagnosis not present

## 2013-12-02 DIAGNOSIS — J3089 Other allergic rhinitis: Secondary | ICD-10-CM | POA: Diagnosis not present

## 2013-12-02 DIAGNOSIS — J3081 Allergic rhinitis due to animal (cat) (dog) hair and dander: Secondary | ICD-10-CM | POA: Diagnosis not present

## 2013-12-22 DIAGNOSIS — H01009 Unspecified blepharitis unspecified eye, unspecified eyelid: Secondary | ICD-10-CM | POA: Diagnosis not present

## 2013-12-22 DIAGNOSIS — H40019 Open angle with borderline findings, low risk, unspecified eye: Secondary | ICD-10-CM | POA: Diagnosis not present

## 2013-12-22 DIAGNOSIS — H25019 Cortical age-related cataract, unspecified eye: Secondary | ICD-10-CM | POA: Diagnosis not present

## 2013-12-22 DIAGNOSIS — H2510 Age-related nuclear cataract, unspecified eye: Secondary | ICD-10-CM | POA: Diagnosis not present

## 2013-12-24 DIAGNOSIS — J309 Allergic rhinitis, unspecified: Secondary | ICD-10-CM | POA: Diagnosis not present

## 2014-01-01 DIAGNOSIS — H40019 Open angle with borderline findings, low risk, unspecified eye: Secondary | ICD-10-CM | POA: Diagnosis not present

## 2014-01-07 DIAGNOSIS — J309 Allergic rhinitis, unspecified: Secondary | ICD-10-CM | POA: Diagnosis not present

## 2014-01-13 DIAGNOSIS — H2589 Other age-related cataract: Secondary | ICD-10-CM | POA: Diagnosis not present

## 2014-01-13 DIAGNOSIS — H2510 Age-related nuclear cataract, unspecified eye: Secondary | ICD-10-CM | POA: Diagnosis not present

## 2014-01-13 DIAGNOSIS — H25019 Cortical age-related cataract, unspecified eye: Secondary | ICD-10-CM | POA: Diagnosis not present

## 2014-01-19 DIAGNOSIS — J309 Allergic rhinitis, unspecified: Secondary | ICD-10-CM | POA: Diagnosis not present

## 2014-01-26 DIAGNOSIS — J309 Allergic rhinitis, unspecified: Secondary | ICD-10-CM | POA: Diagnosis not present

## 2014-02-03 DIAGNOSIS — H2589 Other age-related cataract: Secondary | ICD-10-CM | POA: Diagnosis not present

## 2014-02-03 DIAGNOSIS — H25019 Cortical age-related cataract, unspecified eye: Secondary | ICD-10-CM | POA: Diagnosis not present

## 2014-02-03 DIAGNOSIS — H2510 Age-related nuclear cataract, unspecified eye: Secondary | ICD-10-CM | POA: Diagnosis not present

## 2014-02-05 DIAGNOSIS — J309 Allergic rhinitis, unspecified: Secondary | ICD-10-CM | POA: Diagnosis not present

## 2014-02-10 DIAGNOSIS — J309 Allergic rhinitis, unspecified: Secondary | ICD-10-CM | POA: Diagnosis not present

## 2014-02-16 DIAGNOSIS — F411 Generalized anxiety disorder: Secondary | ICD-10-CM | POA: Diagnosis not present

## 2014-02-16 DIAGNOSIS — F3342 Major depressive disorder, recurrent, in full remission: Secondary | ICD-10-CM | POA: Diagnosis not present

## 2014-02-17 ENCOUNTER — Other Ambulatory Visit: Payer: Self-pay

## 2014-02-17 DIAGNOSIS — Z1231 Encounter for screening mammogram for malignant neoplasm of breast: Secondary | ICD-10-CM

## 2014-02-23 DIAGNOSIS — J309 Allergic rhinitis, unspecified: Secondary | ICD-10-CM | POA: Diagnosis not present

## 2014-03-03 DIAGNOSIS — J309 Allergic rhinitis, unspecified: Secondary | ICD-10-CM | POA: Diagnosis not present

## 2014-03-16 DIAGNOSIS — J309 Allergic rhinitis, unspecified: Secondary | ICD-10-CM | POA: Diagnosis not present

## 2014-03-24 ENCOUNTER — Ambulatory Visit
Admission: RE | Admit: 2014-03-24 | Discharge: 2014-03-24 | Disposition: A | Discharge status: Home / Self Care | Payer: Medicare Other | Source: Ambulatory Visit

## 2014-03-24 DIAGNOSIS — Z1231 Encounter for screening mammogram for malignant neoplasm of breast: Secondary | ICD-10-CM | POA: Diagnosis not present

## 2014-03-25 ENCOUNTER — Other Ambulatory Visit: Payer: Self-pay | Admitting: Internal Medicine

## 2014-03-25 DIAGNOSIS — R928 Other abnormal and inconclusive findings on diagnostic imaging of breast: Secondary | ICD-10-CM

## 2014-04-06 ENCOUNTER — Ambulatory Visit
Admission: RE | Admit: 2014-04-06 | Discharge: 2014-04-06 | Disposition: A | Discharge status: Home / Self Care | Payer: Medicare Other | Source: Ambulatory Visit | Attending: Internal Medicine | Admitting: Internal Medicine

## 2014-04-06 ENCOUNTER — Encounter (INDEPENDENT_AMBULATORY_CARE_PROVIDER_SITE_OTHER): Payer: Self-pay

## 2014-04-06 DIAGNOSIS — R928 Other abnormal and inconclusive findings on diagnostic imaging of breast: Secondary | ICD-10-CM

## 2014-04-06 DIAGNOSIS — J309 Allergic rhinitis, unspecified: Secondary | ICD-10-CM | POA: Diagnosis not present

## 2014-04-06 DIAGNOSIS — R922 Inconclusive mammogram: Secondary | ICD-10-CM | POA: Diagnosis not present

## 2014-04-13 DIAGNOSIS — J309 Allergic rhinitis, unspecified: Secondary | ICD-10-CM | POA: Diagnosis not present

## 2014-05-04 DIAGNOSIS — J309 Allergic rhinitis, unspecified: Secondary | ICD-10-CM | POA: Diagnosis not present

## 2014-05-11 DIAGNOSIS — J309 Allergic rhinitis, unspecified: Secondary | ICD-10-CM | POA: Diagnosis not present

## 2014-05-12 ENCOUNTER — Ambulatory Visit (INDEPENDENT_AMBULATORY_CARE_PROVIDER_SITE_OTHER): Payer: Medicare Other | Admitting: Podiatry

## 2014-05-12 ENCOUNTER — Ambulatory Visit (INDEPENDENT_AMBULATORY_CARE_PROVIDER_SITE_OTHER): Payer: Medicare Other

## 2014-05-12 ENCOUNTER — Encounter: Payer: Self-pay | Admitting: Podiatry

## 2014-05-12 VITALS — BP 142/84 | HR 76 | Resp 12

## 2014-05-12 DIAGNOSIS — R52 Pain, unspecified: Secondary | ICD-10-CM

## 2014-05-12 DIAGNOSIS — M722 Plantar fascial fibromatosis: Secondary | ICD-10-CM

## 2014-05-12 MED ORDER — DICLOFENAC SODIUM 75 MG PO TBEC: 75.0000 mg | DELAYED_RELEASE_TABLET | Freq: Two times a day (BID) | ORAL | Status: DC

## 2014-05-12 MED ORDER — TRIAMCINOLONE ACETONIDE 10 MG/ML IJ SUSP
10.0000 mg | Freq: Once | INTRAMUSCULAR | Status: AC
  Administered 2014-05-12: 10 mg

## 2014-05-12 NOTE — Progress Notes (Signed)
   Subjective:    Patient ID: Alicia [name], female    DOB: May 21, 1946, 68 y.o.   MRN: [id number]  HPI PT STATED RT FOOT IS BEEN SORE FOR 5 MONTHS. THE HEEL IS BEEN THE SAME BUT NOT GETTING BETTER. THE HEEL GET AGGRAVATED BY WALKING AND PRESSURE. TRIED USING ICE FOR 2 WEEKS AND IBUPROFEN FOR PAIN AND IT HELP SOME.    Review of Systems  Allergic/Immunologic: Positive for environmental allergies.  All other systems reviewed and are negative.      Objective:   Physical Exam        Assessment & Plan:

## 2014-05-12 NOTE — Patient Instructions (Signed)

## 2014-05-12 NOTE — Progress Notes (Signed)
Subjective:     Patient ID: Alicia [name], female   DOB: 1946-09-29, 68 y.o.   MRN: [id number]  HPI patient states that developed a lot of pain in my right heel for the last 5 months. Patient states that it's worse when getting up or after sitting   Review of Systems  All other systems reviewed and are negative.      Objective:   Physical Exam  Nursing note and vitals reviewed. Constitutional: She is oriented to person, place, and time.  Cardiovascular: Intact distal pulses.   Musculoskeletal: Normal range of motion.  Neurological: She is oriented to person, place, and time.  Skin: Skin is warm.   neurovascular status intact with range of motion subtalar midtarsal joint adequate and muscle strength within normal limits. Patient has pain to palpation medial fascial band right at the insertional point of the tendon the calcaneus and is noted to have well-perfused digits and mild depression of the arch    Assessment:     Plantar fasciitis right with inflammation and fluid around the medial band    Plan:     H&P and x-rays reviewed. Injected the plantar fascia 3 mg Kenalog 5 mg Xylocaine Marcaine mixture and dispensed fascial brace with instructions on usage. Placed on voltaren 75 mg twice a day and reappoint her recheck

## 2014-05-27 ENCOUNTER — Ambulatory Visit (INDEPENDENT_AMBULATORY_CARE_PROVIDER_SITE_OTHER): Payer: Medicare Other | Admitting: Podiatry

## 2014-05-27 ENCOUNTER — Encounter: Payer: Self-pay | Admitting: Podiatry

## 2014-05-27 VITALS — BP 135/75 | HR 75 | Resp 16

## 2014-05-27 DIAGNOSIS — M722 Plantar fascial fibromatosis: Secondary | ICD-10-CM

## 2014-05-27 NOTE — Progress Notes (Signed)
   Subjective:    Patient ID: Alicia [name], female    DOB: 1946/08/19, 68 y.o.   MRN: [id number]  HPI  Pt states improvement in PF pain since injection and brace.  Review of Systems     Objective:   Physical Exam        Assessment & Plan:

## 2014-05-28 DIAGNOSIS — R7301 Impaired fasting glucose: Secondary | ICD-10-CM | POA: Diagnosis not present

## 2014-05-28 DIAGNOSIS — Z79899 Other long term (current) drug therapy: Secondary | ICD-10-CM | POA: Diagnosis not present

## 2014-05-28 DIAGNOSIS — M949 Disorder of cartilage, unspecified: Secondary | ICD-10-CM | POA: Diagnosis not present

## 2014-05-28 DIAGNOSIS — E785 Hyperlipidemia, unspecified: Secondary | ICD-10-CM | POA: Diagnosis not present

## 2014-05-28 DIAGNOSIS — M899 Disorder of bone, unspecified: Secondary | ICD-10-CM | POA: Diagnosis not present

## 2014-05-31 DIAGNOSIS — F341 Dysthymic disorder: Secondary | ICD-10-CM | POA: Diagnosis not present

## 2014-05-31 DIAGNOSIS — Z1331 Encounter for screening for depression: Secondary | ICD-10-CM | POA: Diagnosis not present

## 2014-05-31 DIAGNOSIS — E785 Hyperlipidemia, unspecified: Secondary | ICD-10-CM | POA: Diagnosis not present

## 2014-05-31 DIAGNOSIS — Z124 Encounter for screening for malignant neoplasm of cervix: Secondary | ICD-10-CM | POA: Diagnosis not present

## 2014-05-31 DIAGNOSIS — K589 Irritable bowel syndrome without diarrhea: Secondary | ICD-10-CM | POA: Diagnosis not present

## 2014-05-31 DIAGNOSIS — K2990 Gastroduodenitis, unspecified, without bleeding: Secondary | ICD-10-CM | POA: Diagnosis not present

## 2014-05-31 DIAGNOSIS — Z85828 Personal history of other malignant neoplasm of skin: Secondary | ICD-10-CM | POA: Diagnosis not present

## 2014-05-31 DIAGNOSIS — Z0000 Encounter for general adult medical examination without abnormal findings: Secondary | ICD-10-CM | POA: Diagnosis not present

## 2014-05-31 DIAGNOSIS — K2970 Gastritis, unspecified, without bleeding: Secondary | ICD-10-CM | POA: Diagnosis not present

## 2014-05-31 DIAGNOSIS — K219 Gastro-esophageal reflux disease without esophagitis: Secondary | ICD-10-CM | POA: Diagnosis not present

## 2014-05-31 DIAGNOSIS — R7301 Impaired fasting glucose: Secondary | ICD-10-CM | POA: Diagnosis not present

## 2014-05-31 NOTE — Progress Notes (Signed)
Subjective:     Patient ID: Alicia [name], female   DOB: [birth year], 68 y.o.   MRN: [id number]  HPI patient presents stating that her heel has improved from previous visit with pain still noted but minimal and it's contents the   Review of Systems     Objective:   Physical Exam Neurovascular status intact with mild discomfort upon palpation to the plantar heel right with no intensity of discomfort    Assessment:     Plantar fasciitis right that is improving    Plan:     Reviewed condition and discussed conservative options consisting of physical therapy and shoe gear modifications and over-the-counter insoles. Reappoint as needed

## 2014-06-01 ENCOUNTER — Other Ambulatory Visit: Payer: Self-pay | Admitting: Dermatology

## 2014-06-01 DIAGNOSIS — L821 Other seborrheic keratosis: Secondary | ICD-10-CM | POA: Diagnosis not present

## 2014-06-01 DIAGNOSIS — D485 Neoplasm of uncertain behavior of skin: Secondary | ICD-10-CM | POA: Diagnosis not present

## 2014-06-01 DIAGNOSIS — D239 Other benign neoplasm of skin, unspecified: Secondary | ICD-10-CM | POA: Diagnosis not present

## 2014-06-01 DIAGNOSIS — L570 Actinic keratosis: Secondary | ICD-10-CM | POA: Diagnosis not present

## 2014-06-01 DIAGNOSIS — Z85828 Personal history of other malignant neoplasm of skin: Secondary | ICD-10-CM | POA: Diagnosis not present

## 2014-06-01 DIAGNOSIS — Z1212 Encounter for screening for malignant neoplasm of rectum: Secondary | ICD-10-CM | POA: Diagnosis not present

## 2014-06-14 DIAGNOSIS — J309 Allergic rhinitis, unspecified: Secondary | ICD-10-CM | POA: Diagnosis not present

## 2014-07-14 DIAGNOSIS — J309 Allergic rhinitis, unspecified: Secondary | ICD-10-CM | POA: Diagnosis not present

## 2014-07-16 DIAGNOSIS — J309 Allergic rhinitis, unspecified: Secondary | ICD-10-CM | POA: Diagnosis not present

## 2014-07-21 ENCOUNTER — Encounter: Payer: Self-pay | Admitting: Internal Medicine

## 2014-08-03 ENCOUNTER — Ambulatory Visit (INDEPENDENT_AMBULATORY_CARE_PROVIDER_SITE_OTHER): Payer: Medicare Other | Admitting: Internal Medicine

## 2014-08-03 ENCOUNTER — Encounter: Payer: Self-pay | Admitting: Internal Medicine

## 2014-08-03 VITALS — BP 120/78 | HR 80 | Ht 66.0 in | Wt 172.0 lb

## 2014-08-03 DIAGNOSIS — K219 Gastro-esophageal reflux disease without esophagitis: Secondary | ICD-10-CM | POA: Diagnosis not present

## 2014-08-03 DIAGNOSIS — Z86010 Personal history of colonic polyps: Secondary | ICD-10-CM | POA: Diagnosis not present

## 2014-08-03 DIAGNOSIS — R195 Other fecal abnormalities: Secondary | ICD-10-CM

## 2014-08-03 DIAGNOSIS — J309 Allergic rhinitis, unspecified: Secondary | ICD-10-CM | POA: Diagnosis not present

## 2014-08-03 MED ORDER — MOVIPREP 100 G PO SOLR
1.0000 | Freq: Once | ORAL | Status: DC
Start: 2014-08-03 — End: 2014-08-12

## 2014-08-03 NOTE — Progress Notes (Signed)
HISTORY OF PRESENT ILLNESS:  Alicia [name] is a 68 y.o. female with past medical history as listed below. She is sent today regarding Hemoccult-positive stool as part of her routine annual checkup. She is a previous GI patient of Dr. Verl Blalock until the time of his retirement. Her GI history is remarkable for chronic GERD for which she is on Nexium and adenomatous colon polyps. Review of outside records shows 2 of 3 Hemoccult tests were positive August 2015. CBC in June of 2015 was normal with hemoglobin 14.4. The patient does take baby aspirin every other day. Transiently used NSAIDs for fasciitis. Her GI review of systems is entirely negative. No reflux symptoms on PPI. Her last upper endoscopy was performed in 2002 and was negative except for "chronic gastritis" with negative testing for Helicobacter pylori. Her last colonoscopy was performed June 2014. In addition to diverticulosis she was noted to have a tubular adenoma which was removed. The patient's GI review of systems is negative, as mentioned. No family history of GI malignancy  REVIEW OF SYSTEMS:  All non-GI ROS negative except for sinus and allergy trouble  Past Medical History  Diagnosis Date  . Environmental allergies   . Anxiety   . GERD (gastroesophageal reflux disease)   . Hyperlipidemia   . SVT (supraventricular tachycardia)   . Osteopenia   . Depression   . Hx of skin cancer, basal cell 03/2016  . IBS (irritable bowel syndrome)   . IFG (impaired fasting glucose)   . Abnormal LFTs   . Vitamin D deficiency   . Colon polyp     adenomatous    Past Surgical History  Procedure Laterality Date  . Dilation and curettage of uterus    . Colonoscopy  2002  . Multiple tooth extractions  2012  . Basal cell removal    . Mohs surgery      rt side of nose  . Cataract removal      bilateral    Social History Alicia [name]  reports that she has never smoked. She has never used smokeless tobacco. She reports that she drinks  about 8.4 ounces of alcohol per week. She reports that she does not use illicit drugs.  family history includes Hemochromatosis in her brother; Hypertension in her brother; Lung cancer in her mother. There is no history of Colon cancer or Stomach cancer.  No Known Allergies     PHYSICAL EXAMINATION: Vital signs: BP 120/78  Pulse 80  Ht 5\' 6"  (1.676 m)  Wt 172 lb (78.019 kg)  BMI 27.77 kg/m2  Constitutional: generally well-appearing, no acute distress Psychiatric: alert and oriented x3, cooperative Eyes: extraocular movements intact, anicteric, conjunctiva pink Mouth: oral pharynx moist, no lesions Neck: supple no lymphadenopathy Cardiovascular: heart regular rate and rhythm, no murmur Lungs: clear to auscultation bilaterally Abdomen: soft, nontender, nondistended, no obvious ascites, no peritoneal signs, normal bowel sounds, no organomegaly Rectal: Deferred until colonoscopy Extremities: no lower extremity edema bilaterally Skin: no lesions on visible extremities Neuro: No focal deficits.   ASSESSMENT:  #1. Asymptomatic Hemoccult-positive stool with normal hemoglobin. Clinical significance uncertain. #2. History of adenomatous colon polyps. Last colonoscopy June 2014 #3. Chronic GERD. Asymptomatic on PPI. Last EGD 2002 #4. General medical problems. Stable. Managed by Dr. Joylene Draft   PLAN:  #1. We discussed possible causes for Hemoccult-positive stool. Though the likelihood of anything significant is low (given colonoscopy a little over 1 year ago and chronic PPI use as well as lack of symptoms and  normal hemoglobin) important underlying GI mucosal pathology is possible including missed lesions. As such, we discussed the pros and cons of endoscopic evaluation at this time. To this end, she was agreeable to colonoscopy and upper endoscopy for further evaluation of this abnormality.The nature of the procedure, as well as the risks, benefits, and alternatives were carefully and  thoroughly reviewed with the patient. Ample time for discussion and questions allowed. The patient understood, was satisfied, and agreed to proceed. Movi prep prescribed. Patient instructed on his use.

## 2014-08-03 NOTE — Patient Instructions (Signed)

## 2014-08-06 DIAGNOSIS — J309 Allergic rhinitis, unspecified: Secondary | ICD-10-CM | POA: Diagnosis not present

## 2014-08-11 DIAGNOSIS — J309 Allergic rhinitis, unspecified: Secondary | ICD-10-CM | POA: Diagnosis not present

## 2014-08-12 ENCOUNTER — Ambulatory Visit (AMBULATORY_SURGERY_CENTER): Payer: Medicare Other | Admitting: Internal Medicine

## 2014-08-12 ENCOUNTER — Other Ambulatory Visit: Payer: Self-pay | Admitting: Internal Medicine

## 2014-08-12 ENCOUNTER — Encounter: Payer: Self-pay | Admitting: Internal Medicine

## 2014-08-12 VITALS — BP 129/49 | HR 65 | Temp 98.6°F | Resp 22 | Ht 66.0 in | Wt 172.0 lb

## 2014-08-12 DIAGNOSIS — F329 Major depressive disorder, single episode, unspecified: Secondary | ICD-10-CM | POA: Diagnosis not present

## 2014-08-12 DIAGNOSIS — D131 Benign neoplasm of stomach: Secondary | ICD-10-CM

## 2014-08-12 DIAGNOSIS — K219 Gastro-esophageal reflux disease without esophagitis: Secondary | ICD-10-CM

## 2014-08-12 DIAGNOSIS — F3289 Other specified depressive episodes: Secondary | ICD-10-CM | POA: Diagnosis not present

## 2014-08-12 DIAGNOSIS — IMO0001 Reserved for inherently not codable concepts without codable children: Secondary | ICD-10-CM

## 2014-08-12 DIAGNOSIS — K625 Hemorrhage of anus and rectum: Secondary | ICD-10-CM | POA: Diagnosis not present

## 2014-08-12 DIAGNOSIS — I471 Supraventricular tachycardia: Secondary | ICD-10-CM | POA: Diagnosis not present

## 2014-08-12 DIAGNOSIS — D126 Benign neoplasm of colon, unspecified: Secondary | ICD-10-CM | POA: Diagnosis not present

## 2014-08-12 DIAGNOSIS — R195 Other fecal abnormalities: Secondary | ICD-10-CM

## 2014-08-12 DIAGNOSIS — Z86010 Personal history of colonic polyps: Secondary | ICD-10-CM

## 2014-08-12 MED ORDER — SODIUM CHLORIDE 0.9 % IV SOLN: 500.0000 mL | INTRAVENOUS | Status: DC

## 2014-08-12 NOTE — Op Note (Addendum)
[facility]  [address] [place], [address]   ENDOSCOPY PROCEDURE REPORT  PATIENT: Alicia, [name]  MR#: [id number] BIRTHDATE: [birth year] , 68  yrs. old GENDER: Female ENDOSCOPIST: Eustace Quail, MD REFERRED BY:  Crist Infante, M.D. PROCEDURE DATE:  08/12/2014 PROCEDURE:  EGD w/ biopsy ASA CLASS:     Class II INDICATIONS:  Heme positive stool. MEDICATIONS: MAC sedation, administered by CRNA and propofol (Diprivan) 100mg  IV TOPICAL ANESTHETIC: none  DESCRIPTION OF PROCEDURE: After the risks benefits and alternatives of the procedure were thoroughly explained, informed consent was obtained.  The LB [id number] endoscope was introduced through the mouth and advanced to the second portion of the duodenum. Without limitations.  The instrument was slowly withdrawn as the mucosa was fully examined.      The esophagus was normal.  The stomach revealed multiple benign fundic gland-type polyps all less than 1 cm.  Biopsies of several taken for confirmation.  The stomach was otherwise normal.  The duodenum was normal.  Retroflexed views revealed no abnormalities. The scope was then withdrawn from the patient and the procedure completed.  COMPLICATIONS: There were no complications. ENDOSCOPIC IMPRESSION: 1. GERD 2. Benign gastric polyps. Fundic gland type seen in patients on PPI   RECOMMENDATIONS: 1.  Await biopsy results 2.  Continue current meds  REPEAT EXAM:  eSigned:  Eustace Quail, MD 08/12/2014 4:26 PM   DC:VUDT Perini, MD and The Patient

## 2014-08-12 NOTE — Patient Instructions (Signed)
ENDOSCOPY CENTER: Refer to the procedure report that was given to you for any specific questions about what was found during the examination.  If the procedure report does not answer your questions, please call your gastroenterologist to clarify.  If you requested that your care partner not be given the details of your procedure findings, then the procedure report has been included in a sealed envelope for you to review at your convenience later.  YOU SHOULD EXPECT: Some feelings of bloating in the abdomen. Passage of more gas than usual.  Walking can help get rid of the air that was put into your GI tract during the procedure and reduce the bloating. If you had a lower endoscopy (such as a colonoscopy or flexible sigmoidoscopy) you may notice spotting of blood in your stool or on the toilet paper. If you underwent a bowel prep for your procedure, then you may not have a normal bowel movement for a few days.  DIET: Your first meal following the procedure should be a light meal and then it is ok to progress to your normal diet.  A half-sandwich or bowl of soup is an example of a good first meal.  Heavy or fried foods are harder to digest and may make you feel nauseous or bloated.  Likewise meals heavy in dairy and vegetables can cause extra gas to form and this can also increase the bloating.  Drink plenty of fluids but you should avoid alcoholic beverages for 24 hours.  ACTIVITY: Your care partner should take you home directly after the procedure.  You should plan to take it easy, moving slowly for the rest of the day.  You can resume normal activity the day after the procedure however you should NOT DRIVE or use heavy machinery for 24 hours (because of the sedation medicines used during the test).    SYMPTOMS TO REPORT IMMEDIATELY: A gastroenterologist can be reached at any hour.  During normal business hours, 8:30 AM to 5:00 PM Monday through Friday, call [phone].  After hours and on weekends,  please call the GI answering service at [phone] who will take a message and have the physician on call contact you.   Following lower endoscopy (colonoscopy or flexible sigmoidoscopy):  Excessive amounts of blood in the stool  Significant tenderness or worsening of abdominal pains  Swelling of the abdomen that is new, acute  Fever of 100F or higher  Following upper endoscopy (EGD)  Vomiting of blood or coffee ground material  New chest pain or pain under the shoulder blades  Painful or persistently difficult swallowing  New shortness of breath  Fever of 100F or higher  Black, tarry-looking stools  FOLLOW UP: If any biopsies were taken you will be contacted by phone or by letter within the next 1-3 weeks.  Call your gastroenterologist if you have not heard about the biopsies in 3 weeks.  Our staff will call the home number listed on your records the next business day following your procedure to check on you and address any questions or concerns that you may have at that time regarding the information given to you following your procedure. This is a courtesy call and so if there is no answer at the home number and we have not heard from you through the emergency physician on call, we will assume that you have returned to your regular daily activities without incident.  SIGNATURES/CONFIDENTIALITY: You and/or your care partner have signed paperwork which will be entered  into your electronic medical record.  These signatures attest to the fact that that the information above on your After Visit Summary has been reviewed and is understood.  Full responsibility of the confidentiality of this discharge information lies with you and/or your care-partner.  Polyp, high fiber diet information given.  Diverticulosis Diverticulosis is the condition that develops when small pouches (diverticula) form in the wall of your colon. Your colon, or large intestine, is where water is absorbed and stool  is formed. The pouches form when the inside layer of your colon pushes through weak spots in the outer layers of your colon. CAUSES  No one knows exactly what causes diverticulosis. RISK FACTORS  Being older than 68. Your risk for this condition increases with age. Diverticulosis is rare in people younger than 40 years. By age 68, almost everyone has it.  Eating a low-fiber diet.  Being frequently constipated.  Being overweight.  Not getting enough exercise.  Smoking.  Taking over-the-counter pain medicines, like aspirin and ibuprofen. SYMPTOMS  Most people with diverticulosis do not have symptoms. DIAGNOSIS  Because diverticulosis often has no symptoms, health care providers often discover the condition during an exam for other colon problems. In many cases, a health care provider will diagnose diverticulosis while using a flexible scope to examine the colon (colonoscopy). TREATMENT  If you have never developed an infection related to diverticulosis, you may not need treatment. If you have had an infection before, treatment may include:  Eating more fruits, vegetables, and grains.  Taking a fiber supplement.  Taking a live bacteria supplement (probiotic).  Taking medicine to relax your colon. HOME CARE INSTRUCTIONS   Drink at least 6-8 glasses of water each day to prevent constipation.  Try not to strain when you have a bowel movement.  Keep all follow-up appointments. If you have had an infection before:  Increase the fiber in your diet as directed by your health care provider or dietitian.  Take a dietary fiber supplement if your health care provider approves.  Only take medicines as directed by your health care provider. SEEK MEDICAL CARE IF:   You have abdominal pain.  You have bloating.  You have cramps.  You have not gone to the bathroom in 3 days. SEEK IMMEDIATE MEDICAL CARE IF:   Your pain gets worse.  Yourbloating becomes very bad.  You have a  fever or chills, and your symptoms suddenly get worse.  You begin vomiting.  You have bowel movements that are bloody or black. MAKE SURE YOU:  Understand these instructions.  Will watch your condition.  Will get help right away if you are not doing well or get worse. Document Released: 08/30/2004 Document Revised: 12/08/2013 Document Reviewed: 10/28/2013 [facility] Patient Information 2015 [place], [place]. This information is not intended to replace advice given to you by your health care provider. Make sure you discuss any questions you have with your health care provider.

## 2014-08-12 NOTE — Progress Notes (Signed)
Report to PACU, RN, vss, BBS= Clear.  

## 2014-08-12 NOTE — Progress Notes (Signed)
Called to room to assist during endoscopic procedure.  Patient ID and intended procedure confirmed with present staff. Received instructions for my participation in the procedure from the performing physician.  

## 2014-08-12 NOTE — Op Note (Addendum)
[facility]  [address]. [place], [address]   COLONOSCOPY PROCEDURE REPORT  PATIENT: Alicia [name], Alicia [name]  MR#: [id number] BIRTHDATE: 14-Jan-1946 , 68  yrs. old GENDER: Female ENDOSCOPIST: Eustace Quail, MD REFERRED RA:XENM Perini, M.D. PROCEDURE DATE:  08/12/2014 PROCEDURE:   Colonoscopy with snare polypectomy x2 First Screening Colonoscopy - Avg.  risk and is 50 yrs.  old or older - No.  Prior Negative Screening - Now for repeat screening. N/A  History of Adenoma - Now for follow-up colonoscopy & has been > or = to 3 yrs.  N/A  Polyps Removed Today? Yes. ASA CLASS:   Class II INDICATIONS:heme-positive stool.   . Previous colonoscopy in June 2014 with small adenoma MEDICATIONS: MAC sedation, administered by CRNA and propofol (Diprivan) 400mg  IV  DESCRIPTION OF PROCEDURE:   After the risks benefits and alternatives of the procedure were thoroughly explained, informed consent was obtained.  A digital rectal exam revealed no abnormalities of the rectum.   The LB [id number]  endoscope was introduced through the anus and advanced to the cecum, which was identified by both the appendix and ileocecal valve. No adverse events experienced.   The quality of the prep was excellent, using MoviPrep  The instrument was then slowly withdrawn as the colon was fully examined.  COLON FINDINGS: Two diminutive polyps were found at the cecum and in the transverse colon.  A polypectomy was performed with a cold snare.  The resection was complete and the polyp tissue was completely retrieved.   Moderate diverticulosis was noted in the left colon.   The colon mucosa was otherwise normal.  Retroflexed views revealed internal hemorrhoids. The time to cecum=3 minutes 02 seconds.  Withdrawal time=14 minutes 12 seconds.  The scope was withdrawn and the procedure completed. COMPLICATIONS: There were no complications.  ENDOSCOPIC IMPRESSION: 1.   Two diminutive polyps were found ;  polypectomy was performed with a cold snare 2.   Moderate diverticulosis was noted in the left colon 3.   The colon mucosa was otherwise normal  RECOMMENDATIONS: 1.  Follow up colonoscopy in 5 years 2.  Upper endoscopy today (see report)   eSigned:  Eustace Quail, MD 08/12/2014 4:21 PM   cc: Crist Infante, MD and The Patient

## 2014-08-13 ENCOUNTER — Telehealth: Payer: Self-pay | Admitting: *Deleted

## 2014-08-13 NOTE — Telephone Encounter (Signed)
  Follow up Call-  Call back number 08/12/2014 05/18/2013 04/16/2012  Post procedure Call Back phone  # [phone]  Permission to leave phone message Yes Yes Yes     No answer at # given.  Left message on voicemail.

## 2014-08-16 DIAGNOSIS — J309 Allergic rhinitis, unspecified: Secondary | ICD-10-CM | POA: Diagnosis not present

## 2014-08-17 ENCOUNTER — Encounter: Payer: Self-pay | Admitting: Internal Medicine

## 2014-08-27 DIAGNOSIS — J309 Allergic rhinitis, unspecified: Secondary | ICD-10-CM | POA: Diagnosis not present

## 2014-08-31 DIAGNOSIS — J309 Allergic rhinitis, unspecified: Secondary | ICD-10-CM | POA: Diagnosis not present

## 2014-08-31 DIAGNOSIS — F411 Generalized anxiety disorder: Secondary | ICD-10-CM | POA: Diagnosis not present

## 2014-08-31 DIAGNOSIS — F3342 Major depressive disorder, recurrent, in full remission: Secondary | ICD-10-CM | POA: Diagnosis not present

## 2014-09-03 ENCOUNTER — Ambulatory Visit: Payer: Medicare Other

## 2014-09-03 DIAGNOSIS — M722 Plantar fascial fibromatosis: Secondary | ICD-10-CM

## 2014-09-03 DIAGNOSIS — J309 Allergic rhinitis, unspecified: Secondary | ICD-10-CM | POA: Diagnosis not present

## 2014-09-07 DIAGNOSIS — J309 Allergic rhinitis, unspecified: Secondary | ICD-10-CM | POA: Diagnosis not present

## 2014-09-16 DIAGNOSIS — H40013 Open angle with borderline findings, low risk, bilateral: Secondary | ICD-10-CM | POA: Diagnosis not present

## 2014-09-20 DIAGNOSIS — Z23 Encounter for immunization: Secondary | ICD-10-CM | POA: Diagnosis not present

## 2014-09-21 DIAGNOSIS — J3081 Allergic rhinitis due to animal (cat) (dog) hair and dander: Secondary | ICD-10-CM | POA: Diagnosis not present

## 2014-09-21 DIAGNOSIS — J301 Allergic rhinitis due to pollen: Secondary | ICD-10-CM | POA: Diagnosis not present

## 2014-09-21 DIAGNOSIS — J3089 Other allergic rhinitis: Secondary | ICD-10-CM | POA: Diagnosis not present

## 2014-09-24 ENCOUNTER — Encounter: Payer: Self-pay | Admitting: Podiatry

## 2014-09-24 ENCOUNTER — Ambulatory Visit: Payer: Medicare Other | Admitting: *Deleted

## 2014-09-24 DIAGNOSIS — M722 Plantar fascial fibromatosis: Secondary | ICD-10-CM

## 2014-09-24 NOTE — Patient Instructions (Signed)

## 2014-09-24 NOTE — Progress Notes (Signed)
   Subjective:    Patient ID: Alicia [name], female    DOB: 06/22/1946, 68 y.o.   MRN: [id number]  HPI    Review of Systems     Objective:   Physical Exam        Assessment & Plan:

## 2014-09-24 NOTE — Progress Notes (Signed)
   Subjective:    Patient ID: Alicia [name], female    DOB: 1946-01-22, 68 y.o.   MRN: [id number]  HPI Comments: Pt states hurt feet are a little tender.  Orthotics were fitted into pt's athletic shoes, and oral and printed instructions were given.  Pt states the orthotics feel good.     Review of Systems     Objective:   Physical Exam        Assessment & Plan:

## 2014-10-08 DIAGNOSIS — J3081 Allergic rhinitis due to animal (cat) (dog) hair and dander: Secondary | ICD-10-CM | POA: Diagnosis not present

## 2014-10-08 DIAGNOSIS — J3089 Other allergic rhinitis: Secondary | ICD-10-CM | POA: Diagnosis not present

## 2014-10-08 DIAGNOSIS — J301 Allergic rhinitis due to pollen: Secondary | ICD-10-CM | POA: Diagnosis not present

## 2014-10-18 ENCOUNTER — Encounter: Payer: Self-pay | Admitting: Podiatry

## 2014-10-18 ENCOUNTER — Ambulatory Visit (INDEPENDENT_AMBULATORY_CARE_PROVIDER_SITE_OTHER): Payer: Medicare Other | Admitting: Podiatry

## 2014-10-18 VITALS — BP 134/75 | HR 88 | Resp 16

## 2014-10-18 DIAGNOSIS — M722 Plantar fascial fibromatosis: Secondary | ICD-10-CM

## 2014-10-18 MED ORDER — TRIAMCINOLONE ACETONIDE 10 MG/ML IJ SUSP
10.0000 mg | Freq: Once | INTRAMUSCULAR | Status: AC
  Administered 2014-10-18: 10 mg

## 2014-10-19 DIAGNOSIS — J3081 Allergic rhinitis due to animal (cat) (dog) hair and dander: Secondary | ICD-10-CM | POA: Diagnosis not present

## 2014-10-19 DIAGNOSIS — J301 Allergic rhinitis due to pollen: Secondary | ICD-10-CM | POA: Diagnosis not present

## 2014-10-19 DIAGNOSIS — J3089 Other allergic rhinitis: Secondary | ICD-10-CM | POA: Diagnosis not present

## 2014-10-19 NOTE — Progress Notes (Signed)
Subjective:     Patient ID: Alicia [name], female   DOB: [birth year], 68 y.o.   MRN: [id number]  HPIpatient states my heel is still bothering me quite a bit right and seems to have had a significant reoccurrence recently with pain worse after sleeping   Review of Systems     Objective:   Physical Exam Neurovascular status intact muscle strength adequate with pain to palpation right plantar heel at the insertion of the tendon into the calcaneus    Assessment:     Plantar fasciitis right with insertional pain and indications that part of this is mechanical    Plan:     Injected the right plantar fascia 3 mg Kenalog 5 g Xylocaine and dispensed night splint with instructions on usage

## 2014-11-02 DIAGNOSIS — J301 Allergic rhinitis due to pollen: Secondary | ICD-10-CM | POA: Diagnosis not present

## 2014-11-02 DIAGNOSIS — J3089 Other allergic rhinitis: Secondary | ICD-10-CM | POA: Diagnosis not present

## 2014-11-02 DIAGNOSIS — J3081 Allergic rhinitis due to animal (cat) (dog) hair and dander: Secondary | ICD-10-CM | POA: Diagnosis not present

## 2014-11-15 ENCOUNTER — Encounter: Payer: Self-pay | Admitting: Podiatry

## 2014-11-15 ENCOUNTER — Ambulatory Visit (INDEPENDENT_AMBULATORY_CARE_PROVIDER_SITE_OTHER): Payer: Medicare Other | Admitting: Podiatry

## 2014-11-15 VITALS — BP 129/77 | HR 80 | Resp 16

## 2014-11-15 DIAGNOSIS — M722 Plantar fascial fibromatosis: Secondary | ICD-10-CM

## 2014-11-16 NOTE — Progress Notes (Signed)
Subjective:     Patient ID: Alicia [name], female   DOB: 26-Feb-1946, 68 y.o.   MRN: [id number]  HPI patient presents stating that her heel is feeling quite a bit better and she is walking with minimal discomfort   Review of Systems     Objective:   Physical Exam Neurovascular status intact with no change in health history and minimal discomfort plantar heel right at the insertional point of the tendon into the calcaneus    Assessment:     Improved plantar fasciitis right with diminished inflammation and fluid buildup    Plan:     H&P and condition discussed and recommended continued physical therapy anti-inflammatories and supportive shoe gear. Patient will be seen back if symptoms indicate

## 2014-11-19 DIAGNOSIS — J3081 Allergic rhinitis due to animal (cat) (dog) hair and dander: Secondary | ICD-10-CM | POA: Diagnosis not present

## 2014-11-19 DIAGNOSIS — J301 Allergic rhinitis due to pollen: Secondary | ICD-10-CM | POA: Diagnosis not present

## 2014-11-19 DIAGNOSIS — J3089 Other allergic rhinitis: Secondary | ICD-10-CM | POA: Diagnosis not present

## 2014-11-30 DIAGNOSIS — J3081 Allergic rhinitis due to animal (cat) (dog) hair and dander: Secondary | ICD-10-CM | POA: Diagnosis not present

## 2014-11-30 DIAGNOSIS — J301 Allergic rhinitis due to pollen: Secondary | ICD-10-CM | POA: Diagnosis not present

## 2014-12-24 DIAGNOSIS — J3089 Other allergic rhinitis: Secondary | ICD-10-CM | POA: Diagnosis not present

## 2014-12-24 DIAGNOSIS — J3081 Allergic rhinitis due to animal (cat) (dog) hair and dander: Secondary | ICD-10-CM | POA: Diagnosis not present

## 2014-12-24 DIAGNOSIS — J301 Allergic rhinitis due to pollen: Secondary | ICD-10-CM | POA: Diagnosis not present

## 2015-01-04 DIAGNOSIS — L821 Other seborrheic keratosis: Secondary | ICD-10-CM | POA: Diagnosis not present

## 2015-01-04 DIAGNOSIS — D2271 Melanocytic nevi of right lower limb, including hip: Secondary | ICD-10-CM | POA: Diagnosis not present

## 2015-01-04 DIAGNOSIS — Z86018 Personal history of other benign neoplasm: Secondary | ICD-10-CM | POA: Diagnosis not present

## 2015-01-04 DIAGNOSIS — Z85828 Personal history of other malignant neoplasm of skin: Secondary | ICD-10-CM | POA: Diagnosis not present

## 2015-01-04 DIAGNOSIS — L570 Actinic keratosis: Secondary | ICD-10-CM | POA: Diagnosis not present

## 2015-01-04 DIAGNOSIS — D235 Other benign neoplasm of skin of trunk: Secondary | ICD-10-CM | POA: Diagnosis not present

## 2015-01-04 DIAGNOSIS — D2261 Melanocytic nevi of right upper limb, including shoulder: Secondary | ICD-10-CM | POA: Diagnosis not present

## 2015-01-04 DIAGNOSIS — Z808 Family history of malignant neoplasm of other organs or systems: Secondary | ICD-10-CM | POA: Diagnosis not present

## 2015-01-06 DIAGNOSIS — J3089 Other allergic rhinitis: Secondary | ICD-10-CM | POA: Diagnosis not present

## 2015-01-06 DIAGNOSIS — J301 Allergic rhinitis due to pollen: Secondary | ICD-10-CM | POA: Diagnosis not present

## 2015-01-06 DIAGNOSIS — J3081 Allergic rhinitis due to animal (cat) (dog) hair and dander: Secondary | ICD-10-CM | POA: Diagnosis not present

## 2015-01-25 DIAGNOSIS — J301 Allergic rhinitis due to pollen: Secondary | ICD-10-CM | POA: Diagnosis not present

## 2015-01-25 DIAGNOSIS — J3089 Other allergic rhinitis: Secondary | ICD-10-CM | POA: Diagnosis not present

## 2015-01-25 DIAGNOSIS — J3081 Allergic rhinitis due to animal (cat) (dog) hair and dander: Secondary | ICD-10-CM | POA: Diagnosis not present

## 2015-02-15 DIAGNOSIS — H9313 Tinnitus, bilateral: Secondary | ICD-10-CM | POA: Diagnosis not present

## 2015-02-15 DIAGNOSIS — H903 Sensorineural hearing loss, bilateral: Secondary | ICD-10-CM | POA: Diagnosis not present

## 2015-02-18 DIAGNOSIS — J301 Allergic rhinitis due to pollen: Secondary | ICD-10-CM | POA: Diagnosis not present

## 2015-02-18 DIAGNOSIS — J3089 Other allergic rhinitis: Secondary | ICD-10-CM | POA: Diagnosis not present

## 2015-02-18 DIAGNOSIS — J3081 Allergic rhinitis due to animal (cat) (dog) hair and dander: Secondary | ICD-10-CM | POA: Diagnosis not present

## 2015-02-25 DIAGNOSIS — J3089 Other allergic rhinitis: Secondary | ICD-10-CM | POA: Diagnosis not present

## 2015-02-25 DIAGNOSIS — K219 Gastro-esophageal reflux disease without esophagitis: Secondary | ICD-10-CM | POA: Diagnosis not present

## 2015-02-25 DIAGNOSIS — J301 Allergic rhinitis due to pollen: Secondary | ICD-10-CM | POA: Diagnosis not present

## 2015-02-25 DIAGNOSIS — J3081 Allergic rhinitis due to animal (cat) (dog) hair and dander: Secondary | ICD-10-CM | POA: Diagnosis not present

## 2015-03-15 DIAGNOSIS — J301 Allergic rhinitis due to pollen: Secondary | ICD-10-CM | POA: Diagnosis not present

## 2015-03-15 DIAGNOSIS — J3081 Allergic rhinitis due to animal (cat) (dog) hair and dander: Secondary | ICD-10-CM | POA: Diagnosis not present

## 2015-03-15 DIAGNOSIS — J3089 Other allergic rhinitis: Secondary | ICD-10-CM | POA: Diagnosis not present

## 2015-04-05 DIAGNOSIS — J301 Allergic rhinitis due to pollen: Secondary | ICD-10-CM | POA: Diagnosis not present

## 2015-04-05 DIAGNOSIS — J3081 Allergic rhinitis due to animal (cat) (dog) hair and dander: Secondary | ICD-10-CM | POA: Diagnosis not present

## 2015-04-05 DIAGNOSIS — J3089 Other allergic rhinitis: Secondary | ICD-10-CM | POA: Diagnosis not present

## 2015-04-06 DIAGNOSIS — J3089 Other allergic rhinitis: Secondary | ICD-10-CM | POA: Diagnosis not present

## 2015-04-06 DIAGNOSIS — J3081 Allergic rhinitis due to animal (cat) (dog) hair and dander: Secondary | ICD-10-CM | POA: Diagnosis not present

## 2015-04-06 DIAGNOSIS — J301 Allergic rhinitis due to pollen: Secondary | ICD-10-CM | POA: Diagnosis not present

## 2015-04-07 DIAGNOSIS — Z961 Presence of intraocular lens: Secondary | ICD-10-CM | POA: Diagnosis not present

## 2015-04-07 DIAGNOSIS — H43813 Vitreous degeneration, bilateral: Secondary | ICD-10-CM | POA: Diagnosis not present

## 2015-04-07 DIAGNOSIS — H26491 Other secondary cataract, right eye: Secondary | ICD-10-CM | POA: Diagnosis not present

## 2015-04-07 DIAGNOSIS — H40013 Open angle with borderline findings, low risk, bilateral: Secondary | ICD-10-CM | POA: Diagnosis not present

## 2015-04-19 DIAGNOSIS — J3089 Other allergic rhinitis: Secondary | ICD-10-CM | POA: Diagnosis not present

## 2015-04-19 DIAGNOSIS — J3081 Allergic rhinitis due to animal (cat) (dog) hair and dander: Secondary | ICD-10-CM | POA: Diagnosis not present

## 2015-04-19 DIAGNOSIS — J301 Allergic rhinitis due to pollen: Secondary | ICD-10-CM | POA: Diagnosis not present

## 2015-04-28 ENCOUNTER — Other Ambulatory Visit: Payer: Self-pay

## 2015-04-28 DIAGNOSIS — Z1231 Encounter for screening mammogram for malignant neoplasm of breast: Secondary | ICD-10-CM

## 2015-05-03 DIAGNOSIS — J301 Allergic rhinitis due to pollen: Secondary | ICD-10-CM | POA: Diagnosis not present

## 2015-05-03 DIAGNOSIS — J3081 Allergic rhinitis due to animal (cat) (dog) hair and dander: Secondary | ICD-10-CM | POA: Diagnosis not present

## 2015-05-03 DIAGNOSIS — J3089 Other allergic rhinitis: Secondary | ICD-10-CM | POA: Diagnosis not present

## 2015-05-09 DIAGNOSIS — H40013 Open angle with borderline findings, low risk, bilateral: Secondary | ICD-10-CM | POA: Diagnosis not present

## 2015-05-13 DIAGNOSIS — J069 Acute upper respiratory infection, unspecified: Secondary | ICD-10-CM | POA: Diagnosis not present

## 2015-05-13 DIAGNOSIS — H1030 Unspecified acute conjunctivitis, unspecified eye: Secondary | ICD-10-CM | POA: Diagnosis not present

## 2015-05-23 ENCOUNTER — Ambulatory Visit: Payer: Medicare Other

## 2015-05-31 DIAGNOSIS — J3081 Allergic rhinitis due to animal (cat) (dog) hair and dander: Secondary | ICD-10-CM | POA: Diagnosis not present

## 2015-05-31 DIAGNOSIS — J3089 Other allergic rhinitis: Secondary | ICD-10-CM | POA: Diagnosis not present

## 2015-05-31 DIAGNOSIS — J301 Allergic rhinitis due to pollen: Secondary | ICD-10-CM | POA: Diagnosis not present

## 2015-06-03 DIAGNOSIS — J301 Allergic rhinitis due to pollen: Secondary | ICD-10-CM | POA: Diagnosis not present

## 2015-06-03 DIAGNOSIS — J3089 Other allergic rhinitis: Secondary | ICD-10-CM | POA: Diagnosis not present

## 2015-06-03 DIAGNOSIS — J3081 Allergic rhinitis due to animal (cat) (dog) hair and dander: Secondary | ICD-10-CM | POA: Diagnosis not present

## 2015-06-14 DIAGNOSIS — J3081 Allergic rhinitis due to animal (cat) (dog) hair and dander: Secondary | ICD-10-CM | POA: Diagnosis not present

## 2015-06-14 DIAGNOSIS — J301 Allergic rhinitis due to pollen: Secondary | ICD-10-CM | POA: Diagnosis not present

## 2015-06-14 DIAGNOSIS — J3089 Other allergic rhinitis: Secondary | ICD-10-CM | POA: Diagnosis not present

## 2015-06-22 ENCOUNTER — Ambulatory Visit: Payer: Medicare Other

## 2015-07-04 DIAGNOSIS — R7301 Impaired fasting glucose: Secondary | ICD-10-CM | POA: Diagnosis not present

## 2015-07-04 DIAGNOSIS — R8299 Other abnormal findings in urine: Secondary | ICD-10-CM | POA: Diagnosis not present

## 2015-07-04 DIAGNOSIS — E785 Hyperlipidemia, unspecified: Secondary | ICD-10-CM | POA: Diagnosis not present

## 2015-07-04 DIAGNOSIS — N390 Urinary tract infection, site not specified: Secondary | ICD-10-CM | POA: Diagnosis not present

## 2015-07-04 DIAGNOSIS — E559 Vitamin D deficiency, unspecified: Secondary | ICD-10-CM | POA: Diagnosis not present

## 2015-07-05 DIAGNOSIS — J3089 Other allergic rhinitis: Secondary | ICD-10-CM | POA: Diagnosis not present

## 2015-07-05 DIAGNOSIS — J3081 Allergic rhinitis due to animal (cat) (dog) hair and dander: Secondary | ICD-10-CM | POA: Diagnosis not present

## 2015-07-05 DIAGNOSIS — J301 Allergic rhinitis due to pollen: Secondary | ICD-10-CM | POA: Diagnosis not present

## 2015-07-06 ENCOUNTER — Ambulatory Visit
Admission: RE | Admit: 2015-07-06 | Discharge: 2015-07-06 | Disposition: A | Discharge status: Home / Self Care | Payer: Medicare Other | Source: Ambulatory Visit

## 2015-07-06 DIAGNOSIS — Z1231 Encounter for screening mammogram for malignant neoplasm of breast: Secondary | ICD-10-CM | POA: Diagnosis not present

## 2015-07-06 DIAGNOSIS — K5900 Constipation, unspecified: Secondary | ICD-10-CM | POA: Diagnosis not present

## 2015-07-06 DIAGNOSIS — K219 Gastro-esophageal reflux disease without esophagitis: Secondary | ICD-10-CM | POA: Diagnosis not present

## 2015-07-06 DIAGNOSIS — K2970 Gastritis, unspecified, without bleeding: Secondary | ICD-10-CM | POA: Diagnosis not present

## 2015-07-06 DIAGNOSIS — Z85828 Personal history of other malignant neoplasm of skin: Secondary | ICD-10-CM | POA: Diagnosis not present

## 2015-07-06 DIAGNOSIS — Z0000 Encounter for general adult medical examination without abnormal findings: Secondary | ICD-10-CM | POA: Diagnosis not present

## 2015-07-06 DIAGNOSIS — Z6824 Body mass index (BMI) 24.0-24.9, adult: Secondary | ICD-10-CM | POA: Diagnosis not present

## 2015-07-06 DIAGNOSIS — E785 Hyperlipidemia, unspecified: Secondary | ICD-10-CM | POA: Diagnosis not present

## 2015-07-06 DIAGNOSIS — K589 Irritable bowel syndrome without diarrhea: Secondary | ICD-10-CM | POA: Diagnosis not present

## 2015-07-06 DIAGNOSIS — Z1389 Encounter for screening for other disorder: Secondary | ICD-10-CM | POA: Diagnosis not present

## 2015-07-06 DIAGNOSIS — F418 Other specified anxiety disorders: Secondary | ICD-10-CM | POA: Diagnosis not present

## 2015-07-06 DIAGNOSIS — R945 Abnormal results of liver function studies: Secondary | ICD-10-CM | POA: Diagnosis not present

## 2015-07-27 DIAGNOSIS — J3089 Other allergic rhinitis: Secondary | ICD-10-CM | POA: Diagnosis not present

## 2015-07-27 DIAGNOSIS — Z85828 Personal history of other malignant neoplasm of skin: Secondary | ICD-10-CM | POA: Diagnosis not present

## 2015-07-27 DIAGNOSIS — D2271 Melanocytic nevi of right lower limb, including hip: Secondary | ICD-10-CM | POA: Diagnosis not present

## 2015-07-27 DIAGNOSIS — Z808 Family history of malignant neoplasm of other organs or systems: Secondary | ICD-10-CM | POA: Diagnosis not present

## 2015-07-27 DIAGNOSIS — D2261 Melanocytic nevi of right upper limb, including shoulder: Secondary | ICD-10-CM | POA: Diagnosis not present

## 2015-07-27 DIAGNOSIS — J301 Allergic rhinitis due to pollen: Secondary | ICD-10-CM | POA: Diagnosis not present

## 2015-07-27 DIAGNOSIS — D225 Melanocytic nevi of trunk: Secondary | ICD-10-CM | POA: Diagnosis not present

## 2015-07-27 DIAGNOSIS — J3081 Allergic rhinitis due to animal (cat) (dog) hair and dander: Secondary | ICD-10-CM | POA: Diagnosis not present

## 2015-07-27 DIAGNOSIS — W57XXXA Bitten or stung by nonvenomous insect and other nonvenomous arthropods, initial encounter: Secondary | ICD-10-CM | POA: Diagnosis not present

## 2015-07-29 DIAGNOSIS — J3089 Other allergic rhinitis: Secondary | ICD-10-CM | POA: Diagnosis not present

## 2015-07-29 DIAGNOSIS — J301 Allergic rhinitis due to pollen: Secondary | ICD-10-CM | POA: Diagnosis not present

## 2015-07-29 DIAGNOSIS — J3081 Allergic rhinitis due to animal (cat) (dog) hair and dander: Secondary | ICD-10-CM | POA: Diagnosis not present

## 2015-08-01 DIAGNOSIS — J3081 Allergic rhinitis due to animal (cat) (dog) hair and dander: Secondary | ICD-10-CM | POA: Diagnosis not present

## 2015-08-01 DIAGNOSIS — J301 Allergic rhinitis due to pollen: Secondary | ICD-10-CM | POA: Diagnosis not present

## 2015-08-01 DIAGNOSIS — J3089 Other allergic rhinitis: Secondary | ICD-10-CM | POA: Diagnosis not present

## 2015-08-09 DIAGNOSIS — J3089 Other allergic rhinitis: Secondary | ICD-10-CM | POA: Diagnosis not present

## 2015-08-09 DIAGNOSIS — J301 Allergic rhinitis due to pollen: Secondary | ICD-10-CM | POA: Diagnosis not present

## 2015-08-09 DIAGNOSIS — J3081 Allergic rhinitis due to animal (cat) (dog) hair and dander: Secondary | ICD-10-CM | POA: Diagnosis not present

## 2015-08-12 DIAGNOSIS — J301 Allergic rhinitis due to pollen: Secondary | ICD-10-CM | POA: Diagnosis not present

## 2015-08-12 DIAGNOSIS — J3081 Allergic rhinitis due to animal (cat) (dog) hair and dander: Secondary | ICD-10-CM | POA: Diagnosis not present

## 2015-08-12 DIAGNOSIS — J3089 Other allergic rhinitis: Secondary | ICD-10-CM | POA: Diagnosis not present

## 2015-08-30 DIAGNOSIS — J3081 Allergic rhinitis due to animal (cat) (dog) hair and dander: Secondary | ICD-10-CM | POA: Diagnosis not present

## 2015-08-30 DIAGNOSIS — J3089 Other allergic rhinitis: Secondary | ICD-10-CM | POA: Diagnosis not present

## 2015-08-30 DIAGNOSIS — J301 Allergic rhinitis due to pollen: Secondary | ICD-10-CM | POA: Diagnosis not present

## 2015-09-02 DIAGNOSIS — J3081 Allergic rhinitis due to animal (cat) (dog) hair and dander: Secondary | ICD-10-CM | POA: Diagnosis not present

## 2015-09-02 DIAGNOSIS — J3089 Other allergic rhinitis: Secondary | ICD-10-CM | POA: Diagnosis not present

## 2015-09-02 DIAGNOSIS — J301 Allergic rhinitis due to pollen: Secondary | ICD-10-CM | POA: Diagnosis not present

## 2015-09-06 DIAGNOSIS — F3342 Major depressive disorder, recurrent, in full remission: Secondary | ICD-10-CM | POA: Diagnosis not present

## 2015-09-06 DIAGNOSIS — F419 Anxiety disorder, unspecified: Secondary | ICD-10-CM | POA: Diagnosis not present

## 2015-09-13 DIAGNOSIS — J3089 Other allergic rhinitis: Secondary | ICD-10-CM | POA: Diagnosis not present

## 2015-09-13 DIAGNOSIS — J3081 Allergic rhinitis due to animal (cat) (dog) hair and dander: Secondary | ICD-10-CM | POA: Diagnosis not present

## 2015-09-13 DIAGNOSIS — J301 Allergic rhinitis due to pollen: Secondary | ICD-10-CM | POA: Diagnosis not present

## 2015-09-20 DIAGNOSIS — J3089 Other allergic rhinitis: Secondary | ICD-10-CM | POA: Diagnosis not present

## 2015-09-20 DIAGNOSIS — J301 Allergic rhinitis due to pollen: Secondary | ICD-10-CM | POA: Diagnosis not present

## 2015-09-20 DIAGNOSIS — J3081 Allergic rhinitis due to animal (cat) (dog) hair and dander: Secondary | ICD-10-CM | POA: Diagnosis not present

## 2015-10-04 DIAGNOSIS — J3081 Allergic rhinitis due to animal (cat) (dog) hair and dander: Secondary | ICD-10-CM | POA: Diagnosis not present

## 2015-10-04 DIAGNOSIS — J3089 Other allergic rhinitis: Secondary | ICD-10-CM | POA: Diagnosis not present

## 2015-10-04 DIAGNOSIS — J301 Allergic rhinitis due to pollen: Secondary | ICD-10-CM | POA: Diagnosis not present

## 2015-10-11 DIAGNOSIS — J3089 Other allergic rhinitis: Secondary | ICD-10-CM | POA: Diagnosis not present

## 2015-10-11 DIAGNOSIS — J301 Allergic rhinitis due to pollen: Secondary | ICD-10-CM | POA: Diagnosis not present

## 2015-10-25 DIAGNOSIS — J301 Allergic rhinitis due to pollen: Secondary | ICD-10-CM | POA: Diagnosis not present

## 2015-10-25 DIAGNOSIS — J3089 Other allergic rhinitis: Secondary | ICD-10-CM | POA: Diagnosis not present

## 2015-10-25 DIAGNOSIS — J3081 Allergic rhinitis due to animal (cat) (dog) hair and dander: Secondary | ICD-10-CM | POA: Diagnosis not present

## 2015-10-26 DIAGNOSIS — Z23 Encounter for immunization: Secondary | ICD-10-CM | POA: Diagnosis not present

## 2015-11-01 DIAGNOSIS — J3081 Allergic rhinitis due to animal (cat) (dog) hair and dander: Secondary | ICD-10-CM | POA: Diagnosis not present

## 2015-11-01 DIAGNOSIS — J3089 Other allergic rhinitis: Secondary | ICD-10-CM | POA: Diagnosis not present

## 2015-11-01 DIAGNOSIS — J301 Allergic rhinitis due to pollen: Secondary | ICD-10-CM | POA: Diagnosis not present

## 2015-11-15 DIAGNOSIS — J3081 Allergic rhinitis due to animal (cat) (dog) hair and dander: Secondary | ICD-10-CM | POA: Diagnosis not present

## 2015-11-15 DIAGNOSIS — J301 Allergic rhinitis due to pollen: Secondary | ICD-10-CM | POA: Diagnosis not present

## 2015-11-15 DIAGNOSIS — J3089 Other allergic rhinitis: Secondary | ICD-10-CM | POA: Diagnosis not present

## 2015-12-15 DIAGNOSIS — J209 Acute bronchitis, unspecified: Secondary | ICD-10-CM | POA: Diagnosis not present

## 2015-12-15 DIAGNOSIS — Z6824 Body mass index (BMI) 24.0-24.9, adult: Secondary | ICD-10-CM | POA: Diagnosis not present

## 2015-12-15 DIAGNOSIS — R509 Fever, unspecified: Secondary | ICD-10-CM | POA: Diagnosis not present

## 2015-12-15 DIAGNOSIS — J302 Other seasonal allergic rhinitis: Secondary | ICD-10-CM | POA: Diagnosis not present

## 2015-12-20 DIAGNOSIS — J3089 Other allergic rhinitis: Secondary | ICD-10-CM | POA: Diagnosis not present

## 2015-12-20 DIAGNOSIS — J301 Allergic rhinitis due to pollen: Secondary | ICD-10-CM | POA: Diagnosis not present

## 2016-01-09 DIAGNOSIS — J3089 Other allergic rhinitis: Secondary | ICD-10-CM | POA: Diagnosis not present

## 2016-01-09 DIAGNOSIS — J3081 Allergic rhinitis due to animal (cat) (dog) hair and dander: Secondary | ICD-10-CM | POA: Diagnosis not present

## 2016-01-09 DIAGNOSIS — J301 Allergic rhinitis due to pollen: Secondary | ICD-10-CM | POA: Diagnosis not present

## 2016-01-16 DIAGNOSIS — J301 Allergic rhinitis due to pollen: Secondary | ICD-10-CM | POA: Diagnosis not present

## 2016-01-16 DIAGNOSIS — J3089 Other allergic rhinitis: Secondary | ICD-10-CM | POA: Diagnosis not present

## 2016-01-16 DIAGNOSIS — J3081 Allergic rhinitis due to animal (cat) (dog) hair and dander: Secondary | ICD-10-CM | POA: Diagnosis not present

## 2016-01-26 DIAGNOSIS — J3081 Allergic rhinitis due to animal (cat) (dog) hair and dander: Secondary | ICD-10-CM | POA: Diagnosis not present

## 2016-01-26 DIAGNOSIS — J3089 Other allergic rhinitis: Secondary | ICD-10-CM | POA: Diagnosis not present

## 2016-01-26 DIAGNOSIS — J301 Allergic rhinitis due to pollen: Secondary | ICD-10-CM | POA: Diagnosis not present

## 2016-02-13 DIAGNOSIS — J3089 Other allergic rhinitis: Secondary | ICD-10-CM | POA: Diagnosis not present

## 2016-02-13 DIAGNOSIS — J3081 Allergic rhinitis due to animal (cat) (dog) hair and dander: Secondary | ICD-10-CM | POA: Diagnosis not present

## 2016-02-13 DIAGNOSIS — J301 Allergic rhinitis due to pollen: Secondary | ICD-10-CM | POA: Diagnosis not present

## 2016-02-16 DIAGNOSIS — Z23 Encounter for immunization: Secondary | ICD-10-CM | POA: Diagnosis not present

## 2016-02-16 DIAGNOSIS — D2271 Melanocytic nevi of right lower limb, including hip: Secondary | ICD-10-CM | POA: Diagnosis not present

## 2016-02-16 DIAGNOSIS — D2261 Melanocytic nevi of right upper limb, including shoulder: Secondary | ICD-10-CM | POA: Diagnosis not present

## 2016-02-16 DIAGNOSIS — Z86018 Personal history of other benign neoplasm: Secondary | ICD-10-CM | POA: Diagnosis not present

## 2016-02-16 DIAGNOSIS — D225 Melanocytic nevi of trunk: Secondary | ICD-10-CM | POA: Diagnosis not present

## 2016-02-16 DIAGNOSIS — Z85828 Personal history of other malignant neoplasm of skin: Secondary | ICD-10-CM | POA: Diagnosis not present

## 2016-02-16 DIAGNOSIS — L570 Actinic keratosis: Secondary | ICD-10-CM | POA: Diagnosis not present

## 2016-02-16 DIAGNOSIS — L821 Other seborrheic keratosis: Secondary | ICD-10-CM | POA: Diagnosis not present

## 2016-02-16 DIAGNOSIS — L853 Xerosis cutis: Secondary | ICD-10-CM | POA: Diagnosis not present

## 2016-02-16 DIAGNOSIS — A630 Anogenital (venereal) warts: Secondary | ICD-10-CM | POA: Diagnosis not present

## 2016-02-16 DIAGNOSIS — L820 Inflamed seborrheic keratosis: Secondary | ICD-10-CM | POA: Diagnosis not present

## 2016-02-20 DIAGNOSIS — J3089 Other allergic rhinitis: Secondary | ICD-10-CM | POA: Diagnosis not present

## 2016-02-20 DIAGNOSIS — J3081 Allergic rhinitis due to animal (cat) (dog) hair and dander: Secondary | ICD-10-CM | POA: Diagnosis not present

## 2016-02-20 DIAGNOSIS — J301 Allergic rhinitis due to pollen: Secondary | ICD-10-CM | POA: Diagnosis not present

## 2016-02-27 DIAGNOSIS — J301 Allergic rhinitis due to pollen: Secondary | ICD-10-CM | POA: Diagnosis not present

## 2016-02-27 DIAGNOSIS — J3081 Allergic rhinitis due to animal (cat) (dog) hair and dander: Secondary | ICD-10-CM | POA: Diagnosis not present

## 2016-02-27 DIAGNOSIS — J3089 Other allergic rhinitis: Secondary | ICD-10-CM | POA: Diagnosis not present

## 2016-02-29 DIAGNOSIS — H43813 Vitreous degeneration, bilateral: Secondary | ICD-10-CM | POA: Diagnosis not present

## 2016-02-29 DIAGNOSIS — H40013 Open angle with borderline findings, low risk, bilateral: Secondary | ICD-10-CM | POA: Diagnosis not present

## 2016-02-29 DIAGNOSIS — Z961 Presence of intraocular lens: Secondary | ICD-10-CM | POA: Diagnosis not present

## 2016-02-29 DIAGNOSIS — H26493 Other secondary cataract, bilateral: Secondary | ICD-10-CM | POA: Diagnosis not present

## 2016-03-17 DIAGNOSIS — Z85828 Personal history of other malignant neoplasm of skin: Secondary | ICD-10-CM

## 2016-03-17 HISTORY — DX: Personal history of other malignant neoplasm of skin: Z85.828

## 2016-03-29 DIAGNOSIS — J301 Allergic rhinitis due to pollen: Secondary | ICD-10-CM | POA: Diagnosis not present

## 2016-03-29 DIAGNOSIS — J3081 Allergic rhinitis due to animal (cat) (dog) hair and dander: Secondary | ICD-10-CM | POA: Diagnosis not present

## 2016-03-29 DIAGNOSIS — J3089 Other allergic rhinitis: Secondary | ICD-10-CM | POA: Diagnosis not present

## 2016-04-10 DIAGNOSIS — J3081 Allergic rhinitis due to animal (cat) (dog) hair and dander: Secondary | ICD-10-CM | POA: Diagnosis not present

## 2016-04-10 DIAGNOSIS — J3089 Other allergic rhinitis: Secondary | ICD-10-CM | POA: Diagnosis not present

## 2016-04-10 DIAGNOSIS — J301 Allergic rhinitis due to pollen: Secondary | ICD-10-CM | POA: Diagnosis not present

## 2016-04-13 DIAGNOSIS — J3089 Other allergic rhinitis: Secondary | ICD-10-CM | POA: Diagnosis not present

## 2016-04-13 DIAGNOSIS — J301 Allergic rhinitis due to pollen: Secondary | ICD-10-CM | POA: Diagnosis not present

## 2016-04-13 DIAGNOSIS — J3081 Allergic rhinitis due to animal (cat) (dog) hair and dander: Secondary | ICD-10-CM | POA: Diagnosis not present

## 2016-04-20 DIAGNOSIS — J301 Allergic rhinitis due to pollen: Secondary | ICD-10-CM | POA: Diagnosis not present

## 2016-04-20 DIAGNOSIS — J3081 Allergic rhinitis due to animal (cat) (dog) hair and dander: Secondary | ICD-10-CM | POA: Diagnosis not present

## 2016-04-20 DIAGNOSIS — J3089 Other allergic rhinitis: Secondary | ICD-10-CM | POA: Diagnosis not present

## 2016-04-25 DIAGNOSIS — J301 Allergic rhinitis due to pollen: Secondary | ICD-10-CM | POA: Diagnosis not present

## 2016-04-25 DIAGNOSIS — J3081 Allergic rhinitis due to animal (cat) (dog) hair and dander: Secondary | ICD-10-CM | POA: Diagnosis not present

## 2016-04-25 DIAGNOSIS — J3089 Other allergic rhinitis: Secondary | ICD-10-CM | POA: Diagnosis not present

## 2016-04-30 DIAGNOSIS — J3089 Other allergic rhinitis: Secondary | ICD-10-CM | POA: Diagnosis not present

## 2016-04-30 DIAGNOSIS — J301 Allergic rhinitis due to pollen: Secondary | ICD-10-CM | POA: Diagnosis not present

## 2016-04-30 DIAGNOSIS — J3081 Allergic rhinitis due to animal (cat) (dog) hair and dander: Secondary | ICD-10-CM | POA: Diagnosis not present

## 2016-05-24 DIAGNOSIS — J3089 Other allergic rhinitis: Secondary | ICD-10-CM | POA: Diagnosis not present

## 2016-05-24 DIAGNOSIS — J3081 Allergic rhinitis due to animal (cat) (dog) hair and dander: Secondary | ICD-10-CM | POA: Diagnosis not present

## 2016-05-24 DIAGNOSIS — J301 Allergic rhinitis due to pollen: Secondary | ICD-10-CM | POA: Diagnosis not present

## 2016-05-29 DIAGNOSIS — E559 Vitamin D deficiency, unspecified: Secondary | ICD-10-CM | POA: Diagnosis not present

## 2016-05-29 DIAGNOSIS — M859 Disorder of bone density and structure, unspecified: Secondary | ICD-10-CM | POA: Diagnosis not present

## 2016-05-30 DIAGNOSIS — J3081 Allergic rhinitis due to animal (cat) (dog) hair and dander: Secondary | ICD-10-CM | POA: Diagnosis not present

## 2016-05-30 DIAGNOSIS — J301 Allergic rhinitis due to pollen: Secondary | ICD-10-CM | POA: Diagnosis not present

## 2016-05-30 DIAGNOSIS — J3089 Other allergic rhinitis: Secondary | ICD-10-CM | POA: Diagnosis not present

## 2016-06-22 DIAGNOSIS — J3081 Allergic rhinitis due to animal (cat) (dog) hair and dander: Secondary | ICD-10-CM | POA: Diagnosis not present

## 2016-06-22 DIAGNOSIS — J301 Allergic rhinitis due to pollen: Secondary | ICD-10-CM | POA: Diagnosis not present

## 2016-06-22 DIAGNOSIS — J3089 Other allergic rhinitis: Secondary | ICD-10-CM | POA: Diagnosis not present

## 2016-06-26 DIAGNOSIS — J3089 Other allergic rhinitis: Secondary | ICD-10-CM | POA: Diagnosis not present

## 2016-06-26 DIAGNOSIS — J3081 Allergic rhinitis due to animal (cat) (dog) hair and dander: Secondary | ICD-10-CM | POA: Diagnosis not present

## 2016-06-26 DIAGNOSIS — J301 Allergic rhinitis due to pollen: Secondary | ICD-10-CM | POA: Diagnosis not present

## 2016-07-20 DIAGNOSIS — J301 Allergic rhinitis due to pollen: Secondary | ICD-10-CM | POA: Diagnosis not present

## 2016-07-20 DIAGNOSIS — J3081 Allergic rhinitis due to animal (cat) (dog) hair and dander: Secondary | ICD-10-CM | POA: Diagnosis not present

## 2016-07-20 DIAGNOSIS — J3089 Other allergic rhinitis: Secondary | ICD-10-CM | POA: Diagnosis not present

## 2016-07-30 DIAGNOSIS — E784 Other hyperlipidemia: Secondary | ICD-10-CM | POA: Diagnosis not present

## 2016-07-30 DIAGNOSIS — E559 Vitamin D deficiency, unspecified: Secondary | ICD-10-CM | POA: Diagnosis not present

## 2016-07-30 DIAGNOSIS — R7301 Impaired fasting glucose: Secondary | ICD-10-CM | POA: Diagnosis not present

## 2016-08-01 DIAGNOSIS — J3081 Allergic rhinitis due to animal (cat) (dog) hair and dander: Secondary | ICD-10-CM | POA: Diagnosis not present

## 2016-08-01 DIAGNOSIS — J3089 Other allergic rhinitis: Secondary | ICD-10-CM | POA: Diagnosis not present

## 2016-08-01 DIAGNOSIS — J301 Allergic rhinitis due to pollen: Secondary | ICD-10-CM | POA: Diagnosis not present

## 2016-08-02 DIAGNOSIS — J302 Other seasonal allergic rhinitis: Secondary | ICD-10-CM | POA: Diagnosis not present

## 2016-08-02 DIAGNOSIS — R7301 Impaired fasting glucose: Secondary | ICD-10-CM | POA: Diagnosis not present

## 2016-08-02 DIAGNOSIS — Z1231 Encounter for screening mammogram for malignant neoplasm of breast: Secondary | ICD-10-CM | POA: Diagnosis not present

## 2016-08-02 DIAGNOSIS — Z0000 Encounter for general adult medical examination without abnormal findings: Secondary | ICD-10-CM | POA: Diagnosis not present

## 2016-08-02 DIAGNOSIS — K589 Irritable bowel syndrome without diarrhea: Secondary | ICD-10-CM | POA: Diagnosis not present

## 2016-08-02 DIAGNOSIS — Z23 Encounter for immunization: Secondary | ICD-10-CM | POA: Diagnosis not present

## 2016-08-02 DIAGNOSIS — Z6825 Body mass index (BMI) 25.0-25.9, adult: Secondary | ICD-10-CM | POA: Diagnosis not present

## 2016-08-02 DIAGNOSIS — F418 Other specified anxiety disorders: Secondary | ICD-10-CM | POA: Diagnosis not present

## 2016-08-02 DIAGNOSIS — K5909 Other constipation: Secondary | ICD-10-CM | POA: Diagnosis not present

## 2016-08-02 DIAGNOSIS — K219 Gastro-esophageal reflux disease without esophagitis: Secondary | ICD-10-CM | POA: Diagnosis not present

## 2016-08-02 DIAGNOSIS — E784 Other hyperlipidemia: Secondary | ICD-10-CM | POA: Diagnosis not present

## 2016-08-02 DIAGNOSIS — Z1389 Encounter for screening for other disorder: Secondary | ICD-10-CM | POA: Diagnosis not present

## 2016-08-02 DIAGNOSIS — Z85828 Personal history of other malignant neoplasm of skin: Secondary | ICD-10-CM | POA: Diagnosis not present

## 2016-08-03 DIAGNOSIS — Z1212 Encounter for screening for malignant neoplasm of rectum: Secondary | ICD-10-CM | POA: Diagnosis not present

## 2016-08-15 DIAGNOSIS — J301 Allergic rhinitis due to pollen: Secondary | ICD-10-CM | POA: Diagnosis not present

## 2016-08-15 DIAGNOSIS — J3089 Other allergic rhinitis: Secondary | ICD-10-CM | POA: Diagnosis not present

## 2016-08-15 DIAGNOSIS — J3081 Allergic rhinitis due to animal (cat) (dog) hair and dander: Secondary | ICD-10-CM | POA: Diagnosis not present

## 2016-09-07 DIAGNOSIS — J3089 Other allergic rhinitis: Secondary | ICD-10-CM | POA: Diagnosis not present

## 2016-09-07 DIAGNOSIS — J301 Allergic rhinitis due to pollen: Secondary | ICD-10-CM | POA: Diagnosis not present

## 2016-09-07 DIAGNOSIS — J3081 Allergic rhinitis due to animal (cat) (dog) hair and dander: Secondary | ICD-10-CM | POA: Diagnosis not present

## 2016-09-18 DIAGNOSIS — L821 Other seborrheic keratosis: Secondary | ICD-10-CM | POA: Diagnosis not present

## 2016-09-18 DIAGNOSIS — D0462 Carcinoma in situ of skin of left upper limb, including shoulder: Secondary | ICD-10-CM | POA: Diagnosis not present

## 2016-09-18 DIAGNOSIS — Z23 Encounter for immunization: Secondary | ICD-10-CM | POA: Diagnosis not present

## 2016-09-18 DIAGNOSIS — Z808 Family history of malignant neoplasm of other organs or systems: Secondary | ICD-10-CM | POA: Diagnosis not present

## 2016-09-18 DIAGNOSIS — D2261 Melanocytic nevi of right upper limb, including shoulder: Secondary | ICD-10-CM | POA: Diagnosis not present

## 2016-09-18 DIAGNOSIS — D225 Melanocytic nevi of trunk: Secondary | ICD-10-CM | POA: Diagnosis not present

## 2016-09-18 DIAGNOSIS — D485 Neoplasm of uncertain behavior of skin: Secondary | ICD-10-CM | POA: Diagnosis not present

## 2016-09-18 DIAGNOSIS — L570 Actinic keratosis: Secondary | ICD-10-CM | POA: Diagnosis not present

## 2016-09-18 DIAGNOSIS — D2271 Melanocytic nevi of right lower limb, including hip: Secondary | ICD-10-CM | POA: Diagnosis not present

## 2016-09-18 DIAGNOSIS — Z86018 Personal history of other benign neoplasm: Secondary | ICD-10-CM | POA: Diagnosis not present

## 2016-09-20 DIAGNOSIS — F4010 Social phobia, unspecified: Secondary | ICD-10-CM | POA: Diagnosis not present

## 2016-09-20 DIAGNOSIS — F3342 Major depressive disorder, recurrent, in full remission: Secondary | ICD-10-CM | POA: Diagnosis not present

## 2016-09-21 DIAGNOSIS — J3089 Other allergic rhinitis: Secondary | ICD-10-CM | POA: Diagnosis not present

## 2016-09-21 DIAGNOSIS — J3081 Allergic rhinitis due to animal (cat) (dog) hair and dander: Secondary | ICD-10-CM | POA: Diagnosis not present

## 2016-09-21 DIAGNOSIS — J301 Allergic rhinitis due to pollen: Secondary | ICD-10-CM | POA: Diagnosis not present

## 2016-10-01 ENCOUNTER — Other Ambulatory Visit: Payer: Self-pay | Admitting: Internal Medicine

## 2016-10-01 DIAGNOSIS — J3089 Other allergic rhinitis: Secondary | ICD-10-CM | POA: Diagnosis not present

## 2016-10-01 DIAGNOSIS — K219 Gastro-esophageal reflux disease without esophagitis: Secondary | ICD-10-CM | POA: Diagnosis not present

## 2016-10-01 DIAGNOSIS — Z1231 Encounter for screening mammogram for malignant neoplasm of breast: Secondary | ICD-10-CM

## 2016-10-01 DIAGNOSIS — J3081 Allergic rhinitis due to animal (cat) (dog) hair and dander: Secondary | ICD-10-CM | POA: Diagnosis not present

## 2016-10-01 DIAGNOSIS — J301 Allergic rhinitis due to pollen: Secondary | ICD-10-CM | POA: Diagnosis not present

## 2016-10-09 DIAGNOSIS — J3081 Allergic rhinitis due to animal (cat) (dog) hair and dander: Secondary | ICD-10-CM | POA: Diagnosis not present

## 2016-10-09 DIAGNOSIS — J3089 Other allergic rhinitis: Secondary | ICD-10-CM | POA: Diagnosis not present

## 2016-10-09 DIAGNOSIS — J301 Allergic rhinitis due to pollen: Secondary | ICD-10-CM | POA: Diagnosis not present

## 2016-10-23 DIAGNOSIS — Z23 Encounter for immunization: Secondary | ICD-10-CM | POA: Diagnosis not present

## 2016-10-23 DIAGNOSIS — D0462 Carcinoma in situ of skin of left upper limb, including shoulder: Secondary | ICD-10-CM | POA: Diagnosis not present

## 2016-11-21 DIAGNOSIS — H40011 Open angle with borderline findings, low risk, right eye: Secondary | ICD-10-CM | POA: Diagnosis not present

## 2016-11-21 DIAGNOSIS — H40012 Open angle with borderline findings, low risk, left eye: Secondary | ICD-10-CM | POA: Diagnosis not present

## 2016-11-22 DIAGNOSIS — J3089 Other allergic rhinitis: Secondary | ICD-10-CM | POA: Diagnosis not present

## 2016-11-22 DIAGNOSIS — J301 Allergic rhinitis due to pollen: Secondary | ICD-10-CM | POA: Diagnosis not present

## 2016-11-22 DIAGNOSIS — J3081 Allergic rhinitis due to animal (cat) (dog) hair and dander: Secondary | ICD-10-CM | POA: Diagnosis not present

## 2016-12-03 ENCOUNTER — Ambulatory Visit
Admission: RE | Admit: 2016-12-03 | Discharge: 2016-12-03 | Disposition: A | Discharge status: Home / Self Care | Payer: Medicare Other | Source: Ambulatory Visit | Attending: Internal Medicine | Admitting: Internal Medicine

## 2016-12-03 DIAGNOSIS — Z1231 Encounter for screening mammogram for malignant neoplasm of breast: Secondary | ICD-10-CM

## 2016-12-18 DIAGNOSIS — J301 Allergic rhinitis due to pollen: Secondary | ICD-10-CM | POA: Diagnosis not present

## 2016-12-18 DIAGNOSIS — J3089 Other allergic rhinitis: Secondary | ICD-10-CM | POA: Diagnosis not present

## 2016-12-18 DIAGNOSIS — J3081 Allergic rhinitis due to animal (cat) (dog) hair and dander: Secondary | ICD-10-CM | POA: Diagnosis not present

## 2017-01-15 DIAGNOSIS — J3089 Other allergic rhinitis: Secondary | ICD-10-CM | POA: Diagnosis not present

## 2017-01-15 DIAGNOSIS — J301 Allergic rhinitis due to pollen: Secondary | ICD-10-CM | POA: Diagnosis not present

## 2017-01-15 DIAGNOSIS — J3081 Allergic rhinitis due to animal (cat) (dog) hair and dander: Secondary | ICD-10-CM | POA: Diagnosis not present

## 2017-01-25 DIAGNOSIS — J3081 Allergic rhinitis due to animal (cat) (dog) hair and dander: Secondary | ICD-10-CM | POA: Diagnosis not present

## 2017-01-25 DIAGNOSIS — J302 Other seasonal allergic rhinitis: Secondary | ICD-10-CM | POA: Diagnosis not present

## 2017-01-25 DIAGNOSIS — J3089 Other allergic rhinitis: Secondary | ICD-10-CM | POA: Diagnosis not present

## 2017-01-25 DIAGNOSIS — Z6826 Body mass index (BMI) 26.0-26.9, adult: Secondary | ICD-10-CM | POA: Diagnosis not present

## 2017-01-25 DIAGNOSIS — J301 Allergic rhinitis due to pollen: Secondary | ICD-10-CM | POA: Diagnosis not present

## 2017-01-25 DIAGNOSIS — J209 Acute bronchitis, unspecified: Secondary | ICD-10-CM | POA: Diagnosis not present

## 2017-02-04 DIAGNOSIS — R05 Cough: Secondary | ICD-10-CM | POA: Diagnosis not present

## 2017-02-04 DIAGNOSIS — J209 Acute bronchitis, unspecified: Secondary | ICD-10-CM | POA: Diagnosis not present

## 2017-02-04 DIAGNOSIS — Z6826 Body mass index (BMI) 26.0-26.9, adult: Secondary | ICD-10-CM | POA: Diagnosis not present

## 2017-02-04 DIAGNOSIS — J309 Allergic rhinitis, unspecified: Secondary | ICD-10-CM | POA: Diagnosis not present

## 2017-02-04 DIAGNOSIS — K219 Gastro-esophageal reflux disease without esophagitis: Secondary | ICD-10-CM | POA: Diagnosis not present

## 2017-02-14 DIAGNOSIS — J3081 Allergic rhinitis due to animal (cat) (dog) hair and dander: Secondary | ICD-10-CM | POA: Diagnosis not present

## 2017-02-14 DIAGNOSIS — J3089 Other allergic rhinitis: Secondary | ICD-10-CM | POA: Diagnosis not present

## 2017-02-14 DIAGNOSIS — J301 Allergic rhinitis due to pollen: Secondary | ICD-10-CM | POA: Diagnosis not present

## 2017-02-19 DIAGNOSIS — J301 Allergic rhinitis due to pollen: Secondary | ICD-10-CM | POA: Diagnosis not present

## 2017-02-19 DIAGNOSIS — J3081 Allergic rhinitis due to animal (cat) (dog) hair and dander: Secondary | ICD-10-CM | POA: Diagnosis not present

## 2017-02-19 DIAGNOSIS — J3089 Other allergic rhinitis: Secondary | ICD-10-CM | POA: Diagnosis not present

## 2017-02-21 DIAGNOSIS — J301 Allergic rhinitis due to pollen: Secondary | ICD-10-CM | POA: Diagnosis not present

## 2017-02-21 DIAGNOSIS — J3089 Other allergic rhinitis: Secondary | ICD-10-CM | POA: Diagnosis not present

## 2017-02-21 DIAGNOSIS — J3081 Allergic rhinitis due to animal (cat) (dog) hair and dander: Secondary | ICD-10-CM | POA: Diagnosis not present

## 2017-03-05 DIAGNOSIS — J3081 Allergic rhinitis due to animal (cat) (dog) hair and dander: Secondary | ICD-10-CM | POA: Diagnosis not present

## 2017-03-05 DIAGNOSIS — J301 Allergic rhinitis due to pollen: Secondary | ICD-10-CM | POA: Diagnosis not present

## 2017-03-05 DIAGNOSIS — J3089 Other allergic rhinitis: Secondary | ICD-10-CM | POA: Diagnosis not present

## 2017-03-11 DIAGNOSIS — J3089 Other allergic rhinitis: Secondary | ICD-10-CM | POA: Diagnosis not present

## 2017-03-11 DIAGNOSIS — J3081 Allergic rhinitis due to animal (cat) (dog) hair and dander: Secondary | ICD-10-CM | POA: Diagnosis not present

## 2017-03-11 DIAGNOSIS — H903 Sensorineural hearing loss, bilateral: Secondary | ICD-10-CM | POA: Diagnosis not present

## 2017-03-11 DIAGNOSIS — J301 Allergic rhinitis due to pollen: Secondary | ICD-10-CM | POA: Diagnosis not present

## 2017-03-11 DIAGNOSIS — H9313 Tinnitus, bilateral: Secondary | ICD-10-CM | POA: Diagnosis not present

## 2017-03-20 DIAGNOSIS — J3081 Allergic rhinitis due to animal (cat) (dog) hair and dander: Secondary | ICD-10-CM | POA: Diagnosis not present

## 2017-03-20 DIAGNOSIS — J301 Allergic rhinitis due to pollen: Secondary | ICD-10-CM | POA: Diagnosis not present

## 2017-03-20 DIAGNOSIS — J3089 Other allergic rhinitis: Secondary | ICD-10-CM | POA: Diagnosis not present

## 2017-04-04 DIAGNOSIS — Z85828 Personal history of other malignant neoplasm of skin: Secondary | ICD-10-CM | POA: Diagnosis not present

## 2017-04-04 DIAGNOSIS — D225 Melanocytic nevi of trunk: Secondary | ICD-10-CM | POA: Diagnosis not present

## 2017-04-04 DIAGNOSIS — Z808 Family history of malignant neoplasm of other organs or systems: Secondary | ICD-10-CM | POA: Diagnosis not present

## 2017-04-04 DIAGNOSIS — L570 Actinic keratosis: Secondary | ICD-10-CM | POA: Diagnosis not present

## 2017-04-04 DIAGNOSIS — D2271 Melanocytic nevi of right lower limb, including hip: Secondary | ICD-10-CM | POA: Diagnosis not present

## 2017-04-04 DIAGNOSIS — D2261 Melanocytic nevi of right upper limb, including shoulder: Secondary | ICD-10-CM | POA: Diagnosis not present

## 2017-04-04 DIAGNOSIS — Z86018 Personal history of other benign neoplasm: Secondary | ICD-10-CM | POA: Diagnosis not present

## 2017-04-11 DIAGNOSIS — J301 Allergic rhinitis due to pollen: Secondary | ICD-10-CM | POA: Diagnosis not present

## 2017-04-11 DIAGNOSIS — J3089 Other allergic rhinitis: Secondary | ICD-10-CM | POA: Diagnosis not present

## 2017-04-11 DIAGNOSIS — J3081 Allergic rhinitis due to animal (cat) (dog) hair and dander: Secondary | ICD-10-CM | POA: Diagnosis not present

## 2017-04-15 DIAGNOSIS — H6123 Impacted cerumen, bilateral: Secondary | ICD-10-CM | POA: Diagnosis not present

## 2017-04-15 DIAGNOSIS — H903 Sensorineural hearing loss, bilateral: Secondary | ICD-10-CM | POA: Diagnosis not present

## 2017-04-15 DIAGNOSIS — H9313 Tinnitus, bilateral: Secondary | ICD-10-CM | POA: Diagnosis not present

## 2017-04-16 DIAGNOSIS — J3081 Allergic rhinitis due to animal (cat) (dog) hair and dander: Secondary | ICD-10-CM | POA: Diagnosis not present

## 2017-04-16 DIAGNOSIS — J301 Allergic rhinitis due to pollen: Secondary | ICD-10-CM | POA: Diagnosis not present

## 2017-04-16 DIAGNOSIS — J3089 Other allergic rhinitis: Secondary | ICD-10-CM | POA: Diagnosis not present

## 2017-04-25 DIAGNOSIS — J3089 Other allergic rhinitis: Secondary | ICD-10-CM | POA: Diagnosis not present

## 2017-04-25 DIAGNOSIS — J301 Allergic rhinitis due to pollen: Secondary | ICD-10-CM | POA: Diagnosis not present

## 2017-04-25 DIAGNOSIS — J3081 Allergic rhinitis due to animal (cat) (dog) hair and dander: Secondary | ICD-10-CM | POA: Diagnosis not present

## 2017-04-30 DIAGNOSIS — J301 Allergic rhinitis due to pollen: Secondary | ICD-10-CM | POA: Diagnosis not present

## 2017-04-30 DIAGNOSIS — J3089 Other allergic rhinitis: Secondary | ICD-10-CM | POA: Diagnosis not present

## 2017-04-30 DIAGNOSIS — J3081 Allergic rhinitis due to animal (cat) (dog) hair and dander: Secondary | ICD-10-CM | POA: Diagnosis not present

## 2017-05-02 DIAGNOSIS — J301 Allergic rhinitis due to pollen: Secondary | ICD-10-CM | POA: Diagnosis not present

## 2017-05-02 DIAGNOSIS — J3081 Allergic rhinitis due to animal (cat) (dog) hair and dander: Secondary | ICD-10-CM | POA: Diagnosis not present

## 2017-05-02 DIAGNOSIS — J3089 Other allergic rhinitis: Secondary | ICD-10-CM | POA: Diagnosis not present

## 2017-05-06 DIAGNOSIS — I471 Supraventricular tachycardia: Secondary | ICD-10-CM | POA: Diagnosis not present

## 2017-05-06 DIAGNOSIS — Z6826 Body mass index (BMI) 26.0-26.9, adult: Secondary | ICD-10-CM | POA: Diagnosis not present

## 2017-05-06 DIAGNOSIS — R0602 Shortness of breath: Secondary | ICD-10-CM | POA: Diagnosis not present

## 2017-05-06 DIAGNOSIS — R05 Cough: Secondary | ICD-10-CM | POA: Diagnosis not present

## 2017-05-23 DIAGNOSIS — J3089 Other allergic rhinitis: Secondary | ICD-10-CM | POA: Diagnosis not present

## 2017-05-23 DIAGNOSIS — J3081 Allergic rhinitis due to animal (cat) (dog) hair and dander: Secondary | ICD-10-CM | POA: Diagnosis not present

## 2017-05-23 DIAGNOSIS — J301 Allergic rhinitis due to pollen: Secondary | ICD-10-CM | POA: Diagnosis not present

## 2017-06-06 DIAGNOSIS — J3081 Allergic rhinitis due to animal (cat) (dog) hair and dander: Secondary | ICD-10-CM | POA: Diagnosis not present

## 2017-06-06 DIAGNOSIS — J3089 Other allergic rhinitis: Secondary | ICD-10-CM | POA: Diagnosis not present

## 2017-06-06 DIAGNOSIS — J301 Allergic rhinitis due to pollen: Secondary | ICD-10-CM | POA: Diagnosis not present

## 2017-06-13 DIAGNOSIS — J301 Allergic rhinitis due to pollen: Secondary | ICD-10-CM | POA: Diagnosis not present

## 2017-06-13 DIAGNOSIS — J3081 Allergic rhinitis due to animal (cat) (dog) hair and dander: Secondary | ICD-10-CM | POA: Diagnosis not present

## 2017-06-13 DIAGNOSIS — J3089 Other allergic rhinitis: Secondary | ICD-10-CM | POA: Diagnosis not present

## 2017-07-04 DIAGNOSIS — J301 Allergic rhinitis due to pollen: Secondary | ICD-10-CM | POA: Diagnosis not present

## 2017-07-04 DIAGNOSIS — J3089 Other allergic rhinitis: Secondary | ICD-10-CM | POA: Diagnosis not present

## 2017-07-04 DIAGNOSIS — J3081 Allergic rhinitis due to animal (cat) (dog) hair and dander: Secondary | ICD-10-CM | POA: Diagnosis not present

## 2017-07-18 DIAGNOSIS — J3089 Other allergic rhinitis: Secondary | ICD-10-CM | POA: Diagnosis not present

## 2017-07-18 DIAGNOSIS — J3081 Allergic rhinitis due to animal (cat) (dog) hair and dander: Secondary | ICD-10-CM | POA: Diagnosis not present

## 2017-07-18 DIAGNOSIS — J301 Allergic rhinitis due to pollen: Secondary | ICD-10-CM | POA: Diagnosis not present

## 2017-07-19 DIAGNOSIS — L821 Other seborrheic keratosis: Secondary | ICD-10-CM | POA: Diagnosis not present

## 2017-08-01 DIAGNOSIS — J301 Allergic rhinitis due to pollen: Secondary | ICD-10-CM | POA: Diagnosis not present

## 2017-08-01 DIAGNOSIS — J3089 Other allergic rhinitis: Secondary | ICD-10-CM | POA: Diagnosis not present

## 2017-08-01 DIAGNOSIS — J3081 Allergic rhinitis due to animal (cat) (dog) hair and dander: Secondary | ICD-10-CM | POA: Diagnosis not present

## 2017-08-22 DIAGNOSIS — J3089 Other allergic rhinitis: Secondary | ICD-10-CM | POA: Diagnosis not present

## 2017-08-22 DIAGNOSIS — J301 Allergic rhinitis due to pollen: Secondary | ICD-10-CM | POA: Diagnosis not present

## 2017-08-22 DIAGNOSIS — J3081 Allergic rhinitis due to animal (cat) (dog) hair and dander: Secondary | ICD-10-CM | POA: Diagnosis not present

## 2017-09-05 DIAGNOSIS — J3089 Other allergic rhinitis: Secondary | ICD-10-CM | POA: Diagnosis not present

## 2017-09-05 DIAGNOSIS — J301 Allergic rhinitis due to pollen: Secondary | ICD-10-CM | POA: Diagnosis not present

## 2017-09-05 DIAGNOSIS — J3081 Allergic rhinitis due to animal (cat) (dog) hair and dander: Secondary | ICD-10-CM | POA: Diagnosis not present

## 2017-09-11 DIAGNOSIS — E559 Vitamin D deficiency, unspecified: Secondary | ICD-10-CM | POA: Diagnosis not present

## 2017-09-11 DIAGNOSIS — R7301 Impaired fasting glucose: Secondary | ICD-10-CM | POA: Diagnosis not present

## 2017-09-11 DIAGNOSIS — Z79899 Other long term (current) drug therapy: Secondary | ICD-10-CM | POA: Diagnosis not present

## 2017-09-11 DIAGNOSIS — E784 Other hyperlipidemia: Secondary | ICD-10-CM | POA: Diagnosis not present

## 2017-09-18 DIAGNOSIS — K5909 Other constipation: Secondary | ICD-10-CM | POA: Diagnosis not present

## 2017-09-18 DIAGNOSIS — Z1231 Encounter for screening mammogram for malignant neoplasm of breast: Secondary | ICD-10-CM | POA: Diagnosis not present

## 2017-09-18 DIAGNOSIS — Z6826 Body mass index (BMI) 26.0-26.9, adult: Secondary | ICD-10-CM | POA: Diagnosis not present

## 2017-09-18 DIAGNOSIS — Z23 Encounter for immunization: Secondary | ICD-10-CM | POA: Diagnosis not present

## 2017-09-18 DIAGNOSIS — Z85828 Personal history of other malignant neoplasm of skin: Secondary | ICD-10-CM | POA: Diagnosis not present

## 2017-09-18 DIAGNOSIS — Z1389 Encounter for screening for other disorder: Secondary | ICD-10-CM | POA: Diagnosis not present

## 2017-09-18 DIAGNOSIS — Z0000 Encounter for general adult medical examination without abnormal findings: Secondary | ICD-10-CM | POA: Diagnosis not present

## 2017-09-18 DIAGNOSIS — E7849 Other hyperlipidemia: Secondary | ICD-10-CM | POA: Diagnosis not present

## 2017-09-18 DIAGNOSIS — R7301 Impaired fasting glucose: Secondary | ICD-10-CM | POA: Diagnosis not present

## 2017-09-18 DIAGNOSIS — I471 Supraventricular tachycardia: Secondary | ICD-10-CM | POA: Diagnosis not present

## 2017-09-18 DIAGNOSIS — J208 Acute bronchitis due to other specified organisms: Secondary | ICD-10-CM | POA: Diagnosis not present

## 2017-09-18 DIAGNOSIS — E559 Vitamin D deficiency, unspecified: Secondary | ICD-10-CM | POA: Diagnosis not present

## 2017-09-18 DIAGNOSIS — M859 Disorder of bone density and structure, unspecified: Secondary | ICD-10-CM | POA: Diagnosis not present

## 2017-09-19 DIAGNOSIS — J301 Allergic rhinitis due to pollen: Secondary | ICD-10-CM | POA: Diagnosis not present

## 2017-09-19 DIAGNOSIS — J3089 Other allergic rhinitis: Secondary | ICD-10-CM | POA: Diagnosis not present

## 2017-09-19 DIAGNOSIS — J3081 Allergic rhinitis due to animal (cat) (dog) hair and dander: Secondary | ICD-10-CM | POA: Diagnosis not present

## 2017-10-18 DIAGNOSIS — J3081 Allergic rhinitis due to animal (cat) (dog) hair and dander: Secondary | ICD-10-CM | POA: Diagnosis not present

## 2017-10-18 DIAGNOSIS — J301 Allergic rhinitis due to pollen: Secondary | ICD-10-CM | POA: Diagnosis not present

## 2017-10-18 DIAGNOSIS — J3089 Other allergic rhinitis: Secondary | ICD-10-CM | POA: Diagnosis not present

## 2017-10-31 DIAGNOSIS — J301 Allergic rhinitis due to pollen: Secondary | ICD-10-CM | POA: Diagnosis not present

## 2017-10-31 DIAGNOSIS — K219 Gastro-esophageal reflux disease without esophagitis: Secondary | ICD-10-CM | POA: Diagnosis not present

## 2017-10-31 DIAGNOSIS — J3089 Other allergic rhinitis: Secondary | ICD-10-CM | POA: Diagnosis not present

## 2017-10-31 DIAGNOSIS — J3081 Allergic rhinitis due to animal (cat) (dog) hair and dander: Secondary | ICD-10-CM | POA: Diagnosis not present

## 2017-11-14 DIAGNOSIS — F3342 Major depressive disorder, recurrent, in full remission: Secondary | ICD-10-CM | POA: Diagnosis not present

## 2017-11-14 DIAGNOSIS — F4010 Social phobia, unspecified: Secondary | ICD-10-CM | POA: Diagnosis not present

## 2017-11-14 DIAGNOSIS — Z634 Disappearance and death of family member: Secondary | ICD-10-CM | POA: Diagnosis not present

## 2017-11-14 DIAGNOSIS — F419 Anxiety disorder, unspecified: Secondary | ICD-10-CM | POA: Diagnosis not present

## 2017-11-18 DIAGNOSIS — H26493 Other secondary cataract, bilateral: Secondary | ICD-10-CM | POA: Diagnosis not present

## 2017-11-18 DIAGNOSIS — H52203 Unspecified astigmatism, bilateral: Secondary | ICD-10-CM | POA: Diagnosis not present

## 2017-11-18 DIAGNOSIS — H04123 Dry eye syndrome of bilateral lacrimal glands: Secondary | ICD-10-CM | POA: Diagnosis not present

## 2017-11-18 DIAGNOSIS — H40013 Open angle with borderline findings, low risk, bilateral: Secondary | ICD-10-CM | POA: Diagnosis not present

## 2017-12-05 DIAGNOSIS — J301 Allergic rhinitis due to pollen: Secondary | ICD-10-CM | POA: Diagnosis not present

## 2017-12-05 DIAGNOSIS — J3081 Allergic rhinitis due to animal (cat) (dog) hair and dander: Secondary | ICD-10-CM | POA: Diagnosis not present

## 2017-12-05 DIAGNOSIS — J3089 Other allergic rhinitis: Secondary | ICD-10-CM | POA: Diagnosis not present

## 2017-12-26 DIAGNOSIS — J3089 Other allergic rhinitis: Secondary | ICD-10-CM | POA: Diagnosis not present

## 2017-12-26 DIAGNOSIS — J3081 Allergic rhinitis due to animal (cat) (dog) hair and dander: Secondary | ICD-10-CM | POA: Diagnosis not present

## 2017-12-26 DIAGNOSIS — J301 Allergic rhinitis due to pollen: Secondary | ICD-10-CM | POA: Diagnosis not present

## 2018-01-21 DIAGNOSIS — J302 Other seasonal allergic rhinitis: Secondary | ICD-10-CM | POA: Diagnosis not present

## 2018-01-21 DIAGNOSIS — Z6826 Body mass index (BMI) 26.0-26.9, adult: Secondary | ICD-10-CM | POA: Diagnosis not present

## 2018-01-21 DIAGNOSIS — R05 Cough: Secondary | ICD-10-CM | POA: Diagnosis not present

## 2018-02-04 DIAGNOSIS — Z6826 Body mass index (BMI) 26.0-26.9, adult: Secondary | ICD-10-CM | POA: Diagnosis not present

## 2018-02-04 DIAGNOSIS — J0100 Acute maxillary sinusitis, unspecified: Secondary | ICD-10-CM | POA: Diagnosis not present

## 2018-02-04 DIAGNOSIS — J302 Other seasonal allergic rhinitis: Secondary | ICD-10-CM | POA: Diagnosis not present

## 2018-02-04 DIAGNOSIS — R05 Cough: Secondary | ICD-10-CM | POA: Diagnosis not present

## 2018-02-04 DIAGNOSIS — J111 Influenza due to unidentified influenza virus with other respiratory manifestations: Secondary | ICD-10-CM | POA: Diagnosis not present

## 2018-02-13 DIAGNOSIS — J3081 Allergic rhinitis due to animal (cat) (dog) hair and dander: Secondary | ICD-10-CM | POA: Diagnosis not present

## 2018-02-13 DIAGNOSIS — J3089 Other allergic rhinitis: Secondary | ICD-10-CM | POA: Diagnosis not present

## 2018-02-13 DIAGNOSIS — J301 Allergic rhinitis due to pollen: Secondary | ICD-10-CM | POA: Diagnosis not present

## 2018-03-07 DIAGNOSIS — J3081 Allergic rhinitis due to animal (cat) (dog) hair and dander: Secondary | ICD-10-CM | POA: Diagnosis not present

## 2018-03-07 DIAGNOSIS — J3089 Other allergic rhinitis: Secondary | ICD-10-CM | POA: Diagnosis not present

## 2018-03-07 DIAGNOSIS — J301 Allergic rhinitis due to pollen: Secondary | ICD-10-CM | POA: Diagnosis not present

## 2018-03-10 DIAGNOSIS — J301 Allergic rhinitis due to pollen: Secondary | ICD-10-CM | POA: Diagnosis not present

## 2018-03-10 DIAGNOSIS — J3081 Allergic rhinitis due to animal (cat) (dog) hair and dander: Secondary | ICD-10-CM | POA: Diagnosis not present

## 2018-04-03 DIAGNOSIS — Z85828 Personal history of other malignant neoplasm of skin: Secondary | ICD-10-CM | POA: Diagnosis not present

## 2018-04-03 DIAGNOSIS — L820 Inflamed seborrheic keratosis: Secondary | ICD-10-CM | POA: Diagnosis not present

## 2018-04-03 DIAGNOSIS — D2271 Melanocytic nevi of right lower limb, including hip: Secondary | ICD-10-CM | POA: Diagnosis not present

## 2018-04-03 DIAGNOSIS — Z808 Family history of malignant neoplasm of other organs or systems: Secondary | ICD-10-CM | POA: Diagnosis not present

## 2018-04-03 DIAGNOSIS — D2261 Melanocytic nevi of right upper limb, including shoulder: Secondary | ICD-10-CM | POA: Diagnosis not present

## 2018-04-03 DIAGNOSIS — D225 Melanocytic nevi of trunk: Secondary | ICD-10-CM | POA: Diagnosis not present

## 2018-04-03 DIAGNOSIS — Z86018 Personal history of other benign neoplasm: Secondary | ICD-10-CM | POA: Diagnosis not present

## 2018-04-03 DIAGNOSIS — L570 Actinic keratosis: Secondary | ICD-10-CM | POA: Diagnosis not present

## 2018-04-22 ENCOUNTER — Other Ambulatory Visit: Payer: Self-pay | Admitting: Internal Medicine

## 2018-04-22 DIAGNOSIS — Z1231 Encounter for screening mammogram for malignant neoplasm of breast: Secondary | ICD-10-CM

## 2018-04-24 DIAGNOSIS — J3089 Other allergic rhinitis: Secondary | ICD-10-CM | POA: Diagnosis not present

## 2018-04-24 DIAGNOSIS — J301 Allergic rhinitis due to pollen: Secondary | ICD-10-CM | POA: Diagnosis not present

## 2018-04-24 DIAGNOSIS — J3081 Allergic rhinitis due to animal (cat) (dog) hair and dander: Secondary | ICD-10-CM | POA: Diagnosis not present

## 2018-05-15 ENCOUNTER — Ambulatory Visit
Admission: RE | Admit: 2018-05-15 | Discharge: 2018-05-15 | Disposition: A | Discharge status: Home / Self Care | Payer: Medicare Other | Source: Ambulatory Visit | Attending: Internal Medicine | Admitting: Internal Medicine

## 2018-05-15 DIAGNOSIS — Z1231 Encounter for screening mammogram for malignant neoplasm of breast: Secondary | ICD-10-CM | POA: Diagnosis not present

## 2018-05-16 DIAGNOSIS — J3089 Other allergic rhinitis: Secondary | ICD-10-CM | POA: Diagnosis not present

## 2018-05-16 DIAGNOSIS — J3081 Allergic rhinitis due to animal (cat) (dog) hair and dander: Secondary | ICD-10-CM | POA: Diagnosis not present

## 2018-05-16 DIAGNOSIS — J301 Allergic rhinitis due to pollen: Secondary | ICD-10-CM | POA: Diagnosis not present

## 2018-05-21 DIAGNOSIS — J301 Allergic rhinitis due to pollen: Secondary | ICD-10-CM | POA: Diagnosis not present

## 2018-05-21 DIAGNOSIS — J3081 Allergic rhinitis due to animal (cat) (dog) hair and dander: Secondary | ICD-10-CM | POA: Diagnosis not present

## 2018-05-21 DIAGNOSIS — J3089 Other allergic rhinitis: Secondary | ICD-10-CM | POA: Diagnosis not present

## 2018-05-23 DIAGNOSIS — J3081 Allergic rhinitis due to animal (cat) (dog) hair and dander: Secondary | ICD-10-CM | POA: Diagnosis not present

## 2018-05-23 DIAGNOSIS — J3089 Other allergic rhinitis: Secondary | ICD-10-CM | POA: Diagnosis not present

## 2018-05-23 DIAGNOSIS — J301 Allergic rhinitis due to pollen: Secondary | ICD-10-CM | POA: Diagnosis not present

## 2018-05-29 DIAGNOSIS — J3089 Other allergic rhinitis: Secondary | ICD-10-CM | POA: Diagnosis not present

## 2018-05-29 DIAGNOSIS — J301 Allergic rhinitis due to pollen: Secondary | ICD-10-CM | POA: Diagnosis not present

## 2018-05-29 DIAGNOSIS — J3081 Allergic rhinitis due to animal (cat) (dog) hair and dander: Secondary | ICD-10-CM | POA: Diagnosis not present

## 2018-06-05 DIAGNOSIS — J3089 Other allergic rhinitis: Secondary | ICD-10-CM | POA: Diagnosis not present

## 2018-06-05 DIAGNOSIS — J3081 Allergic rhinitis due to animal (cat) (dog) hair and dander: Secondary | ICD-10-CM | POA: Diagnosis not present

## 2018-06-05 DIAGNOSIS — J301 Allergic rhinitis due to pollen: Secondary | ICD-10-CM | POA: Diagnosis not present

## 2018-06-13 DIAGNOSIS — J3081 Allergic rhinitis due to animal (cat) (dog) hair and dander: Secondary | ICD-10-CM | POA: Diagnosis not present

## 2018-06-13 DIAGNOSIS — J3089 Other allergic rhinitis: Secondary | ICD-10-CM | POA: Diagnosis not present

## 2018-06-13 DIAGNOSIS — J301 Allergic rhinitis due to pollen: Secondary | ICD-10-CM | POA: Diagnosis not present

## 2018-06-17 DIAGNOSIS — J301 Allergic rhinitis due to pollen: Secondary | ICD-10-CM | POA: Diagnosis not present

## 2018-06-17 DIAGNOSIS — J3081 Allergic rhinitis due to animal (cat) (dog) hair and dander: Secondary | ICD-10-CM | POA: Diagnosis not present

## 2018-06-17 DIAGNOSIS — J3089 Other allergic rhinitis: Secondary | ICD-10-CM | POA: Diagnosis not present

## 2018-07-04 DIAGNOSIS — J301 Allergic rhinitis due to pollen: Secondary | ICD-10-CM | POA: Diagnosis not present

## 2018-07-04 DIAGNOSIS — J3089 Other allergic rhinitis: Secondary | ICD-10-CM | POA: Diagnosis not present

## 2018-07-04 DIAGNOSIS — J3081 Allergic rhinitis due to animal (cat) (dog) hair and dander: Secondary | ICD-10-CM | POA: Diagnosis not present

## 2018-07-17 DIAGNOSIS — J3089 Other allergic rhinitis: Secondary | ICD-10-CM | POA: Diagnosis not present

## 2018-07-17 DIAGNOSIS — J3081 Allergic rhinitis due to animal (cat) (dog) hair and dander: Secondary | ICD-10-CM | POA: Diagnosis not present

## 2018-07-17 DIAGNOSIS — J301 Allergic rhinitis due to pollen: Secondary | ICD-10-CM | POA: Diagnosis not present

## 2018-07-24 DIAGNOSIS — J3089 Other allergic rhinitis: Secondary | ICD-10-CM | POA: Diagnosis not present

## 2018-07-24 DIAGNOSIS — J3081 Allergic rhinitis due to animal (cat) (dog) hair and dander: Secondary | ICD-10-CM | POA: Diagnosis not present

## 2018-07-24 DIAGNOSIS — J301 Allergic rhinitis due to pollen: Secondary | ICD-10-CM | POA: Diagnosis not present

## 2018-07-31 DIAGNOSIS — J301 Allergic rhinitis due to pollen: Secondary | ICD-10-CM | POA: Diagnosis not present

## 2018-07-31 DIAGNOSIS — J3089 Other allergic rhinitis: Secondary | ICD-10-CM | POA: Diagnosis not present

## 2018-07-31 DIAGNOSIS — J3081 Allergic rhinitis due to animal (cat) (dog) hair and dander: Secondary | ICD-10-CM | POA: Diagnosis not present

## 2018-08-15 DIAGNOSIS — J301 Allergic rhinitis due to pollen: Secondary | ICD-10-CM | POA: Diagnosis not present

## 2018-08-15 DIAGNOSIS — J3089 Other allergic rhinitis: Secondary | ICD-10-CM | POA: Diagnosis not present

## 2018-08-15 DIAGNOSIS — J3081 Allergic rhinitis due to animal (cat) (dog) hair and dander: Secondary | ICD-10-CM | POA: Diagnosis not present

## 2018-08-28 DIAGNOSIS — J3081 Allergic rhinitis due to animal (cat) (dog) hair and dander: Secondary | ICD-10-CM | POA: Diagnosis not present

## 2018-08-28 DIAGNOSIS — J301 Allergic rhinitis due to pollen: Secondary | ICD-10-CM | POA: Diagnosis not present

## 2018-08-28 DIAGNOSIS — J3089 Other allergic rhinitis: Secondary | ICD-10-CM | POA: Diagnosis not present

## 2018-09-11 DIAGNOSIS — Z23 Encounter for immunization: Secondary | ICD-10-CM | POA: Diagnosis not present

## 2018-09-25 DIAGNOSIS — J3081 Allergic rhinitis due to animal (cat) (dog) hair and dander: Secondary | ICD-10-CM | POA: Diagnosis not present

## 2018-09-25 DIAGNOSIS — J3089 Other allergic rhinitis: Secondary | ICD-10-CM | POA: Diagnosis not present

## 2018-09-25 DIAGNOSIS — F419 Anxiety disorder, unspecified: Secondary | ICD-10-CM | POA: Diagnosis not present

## 2018-09-25 DIAGNOSIS — F4010 Social phobia, unspecified: Secondary | ICD-10-CM | POA: Diagnosis not present

## 2018-09-25 DIAGNOSIS — J301 Allergic rhinitis due to pollen: Secondary | ICD-10-CM | POA: Diagnosis not present

## 2018-09-25 DIAGNOSIS — Z634 Disappearance and death of family member: Secondary | ICD-10-CM | POA: Diagnosis not present

## 2018-09-25 DIAGNOSIS — F3342 Major depressive disorder, recurrent, in full remission: Secondary | ICD-10-CM | POA: Diagnosis not present

## 2018-10-07 DIAGNOSIS — R7301 Impaired fasting glucose: Secondary | ICD-10-CM | POA: Diagnosis not present

## 2018-10-07 DIAGNOSIS — R82998 Other abnormal findings in urine: Secondary | ICD-10-CM | POA: Diagnosis not present

## 2018-10-07 DIAGNOSIS — E7849 Other hyperlipidemia: Secondary | ICD-10-CM | POA: Diagnosis not present

## 2018-10-07 DIAGNOSIS — E559 Vitamin D deficiency, unspecified: Secondary | ICD-10-CM | POA: Diagnosis not present

## 2018-10-13 DIAGNOSIS — Z0000 Encounter for general adult medical examination without abnormal findings: Secondary | ICD-10-CM | POA: Diagnosis not present

## 2018-10-13 DIAGNOSIS — K589 Irritable bowel syndrome without diarrhea: Secondary | ICD-10-CM | POA: Diagnosis not present

## 2018-10-13 DIAGNOSIS — I471 Supraventricular tachycardia: Secondary | ICD-10-CM | POA: Diagnosis not present

## 2018-10-13 DIAGNOSIS — E7849 Other hyperlipidemia: Secondary | ICD-10-CM | POA: Diagnosis not present

## 2018-10-13 DIAGNOSIS — E559 Vitamin D deficiency, unspecified: Secondary | ICD-10-CM | POA: Diagnosis not present

## 2018-10-13 DIAGNOSIS — M859 Disorder of bone density and structure, unspecified: Secondary | ICD-10-CM | POA: Diagnosis not present

## 2018-10-13 DIAGNOSIS — F418 Other specified anxiety disorders: Secondary | ICD-10-CM | POA: Diagnosis not present

## 2018-10-13 DIAGNOSIS — Z85828 Personal history of other malignant neoplasm of skin: Secondary | ICD-10-CM | POA: Diagnosis not present

## 2018-10-13 DIAGNOSIS — Z1231 Encounter for screening mammogram for malignant neoplasm of breast: Secondary | ICD-10-CM | POA: Diagnosis not present

## 2018-10-13 DIAGNOSIS — Z23 Encounter for immunization: Secondary | ICD-10-CM | POA: Diagnosis not present

## 2018-10-13 DIAGNOSIS — Z1389 Encounter for screening for other disorder: Secondary | ICD-10-CM | POA: Diagnosis not present

## 2018-10-13 DIAGNOSIS — Z6827 Body mass index (BMI) 27.0-27.9, adult: Secondary | ICD-10-CM | POA: Diagnosis not present

## 2018-10-13 DIAGNOSIS — R7301 Impaired fasting glucose: Secondary | ICD-10-CM | POA: Diagnosis not present

## 2018-10-21 DIAGNOSIS — Z1212 Encounter for screening for malignant neoplasm of rectum: Secondary | ICD-10-CM | POA: Diagnosis not present

## 2018-10-30 DIAGNOSIS — J3089 Other allergic rhinitis: Secondary | ICD-10-CM | POA: Diagnosis not present

## 2018-10-30 DIAGNOSIS — J3081 Allergic rhinitis due to animal (cat) (dog) hair and dander: Secondary | ICD-10-CM | POA: Diagnosis not present

## 2018-10-30 DIAGNOSIS — J301 Allergic rhinitis due to pollen: Secondary | ICD-10-CM | POA: Diagnosis not present

## 2018-10-30 DIAGNOSIS — K219 Gastro-esophageal reflux disease without esophagitis: Secondary | ICD-10-CM | POA: Diagnosis not present

## 2018-11-28 DIAGNOSIS — J3081 Allergic rhinitis due to animal (cat) (dog) hair and dander: Secondary | ICD-10-CM | POA: Diagnosis not present

## 2018-11-28 DIAGNOSIS — J301 Allergic rhinitis due to pollen: Secondary | ICD-10-CM | POA: Diagnosis not present

## 2018-12-03 DIAGNOSIS — H40013 Open angle with borderline findings, low risk, bilateral: Secondary | ICD-10-CM | POA: Diagnosis not present

## 2018-12-03 DIAGNOSIS — H43813 Vitreous degeneration, bilateral: Secondary | ICD-10-CM | POA: Diagnosis not present

## 2018-12-03 DIAGNOSIS — H524 Presbyopia: Secondary | ICD-10-CM | POA: Diagnosis not present

## 2018-12-03 DIAGNOSIS — H26493 Other secondary cataract, bilateral: Secondary | ICD-10-CM | POA: Diagnosis not present

## 2019-01-01 DIAGNOSIS — J3081 Allergic rhinitis due to animal (cat) (dog) hair and dander: Secondary | ICD-10-CM | POA: Diagnosis not present

## 2019-01-01 DIAGNOSIS — J301 Allergic rhinitis due to pollen: Secondary | ICD-10-CM | POA: Diagnosis not present

## 2019-01-01 DIAGNOSIS — J3089 Other allergic rhinitis: Secondary | ICD-10-CM | POA: Diagnosis not present

## 2019-02-05 DIAGNOSIS — J301 Allergic rhinitis due to pollen: Secondary | ICD-10-CM | POA: Diagnosis not present

## 2019-02-05 DIAGNOSIS — J3081 Allergic rhinitis due to animal (cat) (dog) hair and dander: Secondary | ICD-10-CM | POA: Diagnosis not present

## 2019-02-05 DIAGNOSIS — J3089 Other allergic rhinitis: Secondary | ICD-10-CM | POA: Diagnosis not present

## 2019-03-19 DIAGNOSIS — J3089 Other allergic rhinitis: Secondary | ICD-10-CM | POA: Diagnosis not present

## 2019-03-19 DIAGNOSIS — J3081 Allergic rhinitis due to animal (cat) (dog) hair and dander: Secondary | ICD-10-CM | POA: Diagnosis not present

## 2019-03-19 DIAGNOSIS — J301 Allergic rhinitis due to pollen: Secondary | ICD-10-CM | POA: Diagnosis not present

## 2019-03-19 DIAGNOSIS — H1045 Other chronic allergic conjunctivitis: Secondary | ICD-10-CM | POA: Diagnosis not present

## 2019-05-07 DIAGNOSIS — J3089 Other allergic rhinitis: Secondary | ICD-10-CM | POA: Diagnosis not present

## 2019-05-07 DIAGNOSIS — J3081 Allergic rhinitis due to animal (cat) (dog) hair and dander: Secondary | ICD-10-CM | POA: Diagnosis not present

## 2019-05-07 DIAGNOSIS — J301 Allergic rhinitis due to pollen: Secondary | ICD-10-CM | POA: Diagnosis not present

## 2019-05-14 DIAGNOSIS — J3081 Allergic rhinitis due to animal (cat) (dog) hair and dander: Secondary | ICD-10-CM | POA: Diagnosis not present

## 2019-05-14 DIAGNOSIS — J301 Allergic rhinitis due to pollen: Secondary | ICD-10-CM | POA: Diagnosis not present

## 2019-05-14 DIAGNOSIS — J3089 Other allergic rhinitis: Secondary | ICD-10-CM | POA: Diagnosis not present

## 2019-05-21 DIAGNOSIS — J3089 Other allergic rhinitis: Secondary | ICD-10-CM | POA: Diagnosis not present

## 2019-05-21 DIAGNOSIS — J301 Allergic rhinitis due to pollen: Secondary | ICD-10-CM | POA: Diagnosis not present

## 2019-05-21 DIAGNOSIS — J3081 Allergic rhinitis due to animal (cat) (dog) hair and dander: Secondary | ICD-10-CM | POA: Diagnosis not present

## 2019-06-05 DIAGNOSIS — J3081 Allergic rhinitis due to animal (cat) (dog) hair and dander: Secondary | ICD-10-CM | POA: Diagnosis not present

## 2019-06-05 DIAGNOSIS — J3089 Other allergic rhinitis: Secondary | ICD-10-CM | POA: Diagnosis not present

## 2019-06-05 DIAGNOSIS — J301 Allergic rhinitis due to pollen: Secondary | ICD-10-CM | POA: Diagnosis not present

## 2019-06-08 DIAGNOSIS — J301 Allergic rhinitis due to pollen: Secondary | ICD-10-CM | POA: Diagnosis not present

## 2019-06-08 DIAGNOSIS — J3081 Allergic rhinitis due to animal (cat) (dog) hair and dander: Secondary | ICD-10-CM | POA: Diagnosis not present

## 2019-06-08 DIAGNOSIS — J3089 Other allergic rhinitis: Secondary | ICD-10-CM | POA: Diagnosis not present

## 2019-06-11 DIAGNOSIS — J3081 Allergic rhinitis due to animal (cat) (dog) hair and dander: Secondary | ICD-10-CM | POA: Diagnosis not present

## 2019-06-11 DIAGNOSIS — J3089 Other allergic rhinitis: Secondary | ICD-10-CM | POA: Diagnosis not present

## 2019-06-11 DIAGNOSIS — J301 Allergic rhinitis due to pollen: Secondary | ICD-10-CM | POA: Diagnosis not present

## 2019-06-16 DIAGNOSIS — J301 Allergic rhinitis due to pollen: Secondary | ICD-10-CM | POA: Diagnosis not present

## 2019-06-16 DIAGNOSIS — J3081 Allergic rhinitis due to animal (cat) (dog) hair and dander: Secondary | ICD-10-CM | POA: Diagnosis not present

## 2019-06-16 DIAGNOSIS — J3089 Other allergic rhinitis: Secondary | ICD-10-CM | POA: Diagnosis not present

## 2019-06-23 DIAGNOSIS — J3089 Other allergic rhinitis: Secondary | ICD-10-CM | POA: Diagnosis not present

## 2019-06-23 DIAGNOSIS — J301 Allergic rhinitis due to pollen: Secondary | ICD-10-CM | POA: Diagnosis not present

## 2019-06-23 DIAGNOSIS — J3081 Allergic rhinitis due to animal (cat) (dog) hair and dander: Secondary | ICD-10-CM | POA: Diagnosis not present

## 2019-07-17 DIAGNOSIS — J301 Allergic rhinitis due to pollen: Secondary | ICD-10-CM | POA: Diagnosis not present

## 2019-07-17 DIAGNOSIS — J3089 Other allergic rhinitis: Secondary | ICD-10-CM | POA: Diagnosis not present

## 2019-07-17 DIAGNOSIS — J3081 Allergic rhinitis due to animal (cat) (dog) hair and dander: Secondary | ICD-10-CM | POA: Diagnosis not present

## 2019-07-20 ENCOUNTER — Encounter: Payer: Self-pay | Admitting: Internal Medicine

## 2019-07-31 DIAGNOSIS — J3081 Allergic rhinitis due to animal (cat) (dog) hair and dander: Secondary | ICD-10-CM | POA: Diagnosis not present

## 2019-07-31 DIAGNOSIS — J301 Allergic rhinitis due to pollen: Secondary | ICD-10-CM | POA: Diagnosis not present

## 2019-07-31 DIAGNOSIS — J3089 Other allergic rhinitis: Secondary | ICD-10-CM | POA: Diagnosis not present

## 2019-08-05 ENCOUNTER — Other Ambulatory Visit: Payer: Self-pay | Admitting: Internal Medicine

## 2019-08-05 ENCOUNTER — Encounter: Payer: Self-pay | Admitting: Internal Medicine

## 2019-08-05 DIAGNOSIS — Z1231 Encounter for screening mammogram for malignant neoplasm of breast: Secondary | ICD-10-CM

## 2019-08-11 DIAGNOSIS — J3081 Allergic rhinitis due to animal (cat) (dog) hair and dander: Secondary | ICD-10-CM | POA: Diagnosis not present

## 2019-08-11 DIAGNOSIS — J3089 Other allergic rhinitis: Secondary | ICD-10-CM | POA: Diagnosis not present

## 2019-08-11 DIAGNOSIS — J301 Allergic rhinitis due to pollen: Secondary | ICD-10-CM | POA: Diagnosis not present

## 2019-08-13 DIAGNOSIS — Z23 Encounter for immunization: Secondary | ICD-10-CM | POA: Diagnosis not present

## 2019-08-21 ENCOUNTER — Ambulatory Visit (AMBULATORY_SURGERY_CENTER): Payer: Self-pay

## 2019-08-21 ENCOUNTER — Other Ambulatory Visit: Payer: Self-pay

## 2019-08-21 VITALS — Ht 66.0 in | Wt 172.0 lb

## 2019-08-21 DIAGNOSIS — J3089 Other allergic rhinitis: Secondary | ICD-10-CM | POA: Diagnosis not present

## 2019-08-21 DIAGNOSIS — J3081 Allergic rhinitis due to animal (cat) (dog) hair and dander: Secondary | ICD-10-CM | POA: Diagnosis not present

## 2019-08-21 DIAGNOSIS — Z86010 Personal history of colonic polyps: Secondary | ICD-10-CM

## 2019-08-21 DIAGNOSIS — J301 Allergic rhinitis due to pollen: Secondary | ICD-10-CM | POA: Diagnosis not present

## 2019-08-21 MED ORDER — NA SULFATE-K SULFATE-MG SULF 17.5-3.13-1.6 GM/177ML PO SOLN: 1.0000 | Freq: Once | ORAL | 0 refills | Status: AC

## 2019-08-21 NOTE — Progress Notes (Signed)
Denies allergies to eggs or soy products. Denies complication of anesthesia or sedation. Denies use of weight loss medication. Denies use of O2.   Emmi instructions given for colonoscopy.  A sample of Suprep was given to the patient because her Medicare insurance does not pay anything for the prep.

## 2019-09-03 ENCOUNTER — Ambulatory Visit
Admission: RE | Admit: 2019-09-03 | Discharge: 2019-09-03 | Disposition: A | Discharge status: Home / Self Care | Payer: Medicare Other | Source: Ambulatory Visit | Attending: Internal Medicine | Admitting: Internal Medicine

## 2019-09-03 ENCOUNTER — Other Ambulatory Visit: Payer: Self-pay

## 2019-09-03 DIAGNOSIS — Z1231 Encounter for screening mammogram for malignant neoplasm of breast: Secondary | ICD-10-CM

## 2019-09-04 DIAGNOSIS — J3089 Other allergic rhinitis: Secondary | ICD-10-CM | POA: Diagnosis not present

## 2019-09-04 DIAGNOSIS — J301 Allergic rhinitis due to pollen: Secondary | ICD-10-CM | POA: Diagnosis not present

## 2019-09-04 DIAGNOSIS — J3081 Allergic rhinitis due to animal (cat) (dog) hair and dander: Secondary | ICD-10-CM | POA: Diagnosis not present

## 2019-09-07 ENCOUNTER — Telehealth: Payer: Self-pay

## 2019-09-07 NOTE — Telephone Encounter (Signed)
Pt answered " NO" to all Covid questions.

## 2019-09-07 NOTE — Telephone Encounter (Signed)
Covid-19 screening questions   Do you now or have you had a fever in the last 14 days?  Do you have any respiratory symptoms of shortness of breath or cough now or in the last 14 days?  Do you have any family members or close contacts with diagnosed or suspected Covid-19 in the past 14 days?  Have you been tested for Covid-19 and found to be positive?       

## 2019-09-08 ENCOUNTER — Ambulatory Visit (AMBULATORY_SURGERY_CENTER): Payer: Medicare Other | Admitting: Internal Medicine

## 2019-09-08 ENCOUNTER — Encounter: Payer: Self-pay | Admitting: Internal Medicine

## 2019-09-08 ENCOUNTER — Other Ambulatory Visit: Payer: Self-pay | Admitting: Internal Medicine

## 2019-09-08 ENCOUNTER — Other Ambulatory Visit: Payer: Self-pay

## 2019-09-08 VITALS — BP 127/72 | HR 66 | Temp 98.7°F | Resp 16 | Ht 66.0 in | Wt 172.0 lb

## 2019-09-08 DIAGNOSIS — Z86010 Personal history of colonic polyps: Secondary | ICD-10-CM

## 2019-09-08 DIAGNOSIS — D120 Benign neoplasm of cecum: Secondary | ICD-10-CM | POA: Diagnosis not present

## 2019-09-08 MED ORDER — SODIUM CHLORIDE 0.9 % IV SOLN: 500.0000 mL | Freq: Once | INTRAVENOUS | Status: DC

## 2019-09-08 NOTE — Patient Instructions (Signed)
Handouts given for polyps, diverticulosis and hemorrhoids  YOU HAD AN ENDOSCOPIC PROCEDURE TODAY AT THE [facility]:   Refer to the procedure report that was given to you for any specific questions about what was found during the examination.  If the procedure report does not answer your questions, please call your gastroenterologist to clarify.  If you requested that your care partner not be given the details of your procedure findings, then the procedure report has been included in a sealed envelope for you to review at your convenience later.  YOU SHOULD EXPECT: Some feelings of bloating in the abdomen. Passage of more gas than usual.  Walking can help get rid of the air that was put into your GI tract during the procedure and reduce the bloating. If you had a lower endoscopy (such as a colonoscopy or flexible sigmoidoscopy) you may notice spotting of blood in your stool or on the toilet paper. If you underwent a bowel prep for your procedure, you may not have a normal bowel movement for a few days.  Please Note:  You might notice some irritation and congestion in your nose or some drainage.  This is from the oxygen used during your procedure.  There is no need for concern and it should clear up in a day or so.  SYMPTOMS TO REPORT IMMEDIATELY:   Following lower endoscopy (colonoscopy or flexible sigmoidoscopy):  Excessive amounts of blood in the stool  Significant tenderness or worsening of abdominal pains  Swelling of the abdomen that is new, acute  Fever of 100F or higher  For urgent or emergent issues, a gastroenterologist can be reached at any hour by calling [phone].   DIET:  We do recommend a small meal at first, but then you may proceed to your regular diet.  Drink plenty of fluids but you should avoid alcoholic beverages for 24 hours.  ACTIVITY:  You should plan to take it easy for the rest of today and you should NOT DRIVE or use heavy machinery until tomorrow  (because of the sedation medicines used during the test).    FOLLOW UP: Our staff will call the number listed on your records 48-72 hours following your procedure to check on you and address any questions or concerns that you may have regarding the information given to you following your procedure. If we do not reach you, we will leave a message.  We will attempt to reach you two times.  During this call, we will ask if you have developed any symptoms of COVID 19. If you develop any symptoms (ie: fever, flu-like symptoms, shortness of breath, cough etc.) before then, please call [phone].  If you test positive for Covid 19 in the 2 weeks post procedure, please call and report this information to us.    If any biopsies were taken you will be contacted by phone or by letter within the next 1-3 weeks.  Please call us at [phone] if you have not heard about the biopsies in 3 weeks.    SIGNATURES/CONFIDENTIALITY: You and/or your care partner have signed paperwork which will be entered into your electronic medical record.  These signatures attest to the fact that that the information above on your After Visit Summary has been reviewed and is understood.  Full responsibility of the confidentiality of this discharge information lies with you and/or your care-partner. 

## 2019-09-08 NOTE — Progress Notes (Signed)
Pt's states no medical or surgical changes since previsit or office visit. 

## 2019-09-08 NOTE — Progress Notes (Signed)
PT taken to PACU. Monitors in place. VSS. Report given to RN. 

## 2019-09-08 NOTE — Op Note (Signed)
[place] Patient Name: Alicia [name] Procedure Date: 09/08/2019 1:24 PM MRN: [id number] Endoscopist: Docia Chuck. Henrene Pastor , MD Age: 73 Referring MD:  Date of Birth: 1946-12-10 Gender: Female Account #: [id number] Procedure:                Colonoscopy with cold snare polypectomy x 1 Indications:              High risk colon cancer surveillance: Personal                            history of multiple (3 or more) adenomas, High risk                            colon cancer surveillance: Personal history of                            sessile serrated colon polyp (less than 10 mm in                            size) with no dysplasia multiple previous                            examinations (Dr. Sharlett Iles) 2002, 2013, 2014, 2015                            (myself) Medicines:                Monitored Anesthesia Care Procedure:                Pre-Anesthesia Assessment:                           - Prior to the procedure, a History and Physical                            was performed, and patient medications and                            allergies were reviewed. The patient's tolerance of                            previous anesthesia was also reviewed. The risks                            and benefits of the procedure and the sedation                            options and risks were discussed with the patient.                            All questions were answered, and informed consent                            was obtained. Prior Anticoagulants: The patient has  taken no previous anticoagulant or antiplatelet                            agents. ASA Grade Assessment: II - A patient with                            mild systemic disease. After reviewing the risks                            and benefits, the patient was deemed in                            satisfactory condition to undergo the procedure.                           After obtaining informed consent, the  colonoscope                            was passed under direct vision. Throughout the                            procedure, the patient's blood pressure, pulse, and                            oxygen saturations were monitored continuously. The                            Colonoscope was introduced through the anus and                            advanced to the the cecum, identified by                            appendiceal orifice and ileocecal valve. The                            ileocecal valve, appendiceal orifice, and rectum                            were photographed. The quality of the bowel                            preparation was excellent. The colonoscopy was                            performed without difficulty. The patient tolerated                            the procedure well. The bowel preparation used was                            SUPREP via split dose instruction. Scope In: 1:44:19 PM Scope Out: 1:59:31 PM Scope Withdrawal Time: 0 hours 11 minutes 10 seconds  Total Procedure Duration: 0  hours 15 minutes 12 seconds  Findings:                 A 2 mm polyp was found in the cecum. The polyp was                            removed with a cold snare. Resection and retrieval                            were complete.                           Many small and large-mouthed diverticula were found                            in the transverse colon and left colon.                           Internal hemorrhoids were found during                            retroflexion. The hemorrhoids were small.                           The exam was otherwise without abnormality on                            direct and retroflexion views. Complications:            No immediate complications. Estimated blood loss:                            None. Estimated Blood Loss:     Estimated blood loss: none. Impression:               - One 2 mm polyp in the cecum, removed with a cold                             snare. Resected and retrieved.                           - Diverticulosis in the transverse colon and in the                            left colon.                           - Internal hemorrhoids.                           - The examination was otherwise normal on direct                            and retroflexion views. Recommendation:           - Repeat colonoscopy in 5 years for surveillance                            (  personal history).                           - Patient has a contact number available for                            emergencies. The signs and symptoms of potential                            delayed complications were discussed with the                            patient. Return to normal activities tomorrow.                            Written discharge instructions were provided to the                            patient.                           - Resume previous diet.                           - Continue present medications.                           - Await pathology results. Docia Chuck. Henrene Pastor, MD 09/08/2019 2:07:50 PM This report has been signed electronically.

## 2019-09-08 NOTE — Progress Notes (Signed)
Called to room to assist during endoscopic procedure.  Patient ID and intended procedure confirmed with present staff. Received instructions for my participation in the procedure from the performing physician.  

## 2019-09-10 ENCOUNTER — Telehealth: Payer: Self-pay | Admitting: *Deleted

## 2019-09-10 NOTE — Telephone Encounter (Signed)
  Follow up Call-  Call back number 09/08/2019  Post procedure Call Back phone  # [phone]  Permission to leave phone message Yes  Some recent data might be hidden     Patient questions:  Do you have a fever, pain , or abdominal swelling? No. Pain Score  0 *  Have you tolerated food without any problems? Yes.    Have you been able to return to your normal activities? Yes.    Do you have any questions about your discharge instructions: Diet   No. Medications  No. Follow up visit  No.  Do you have questions or concerns about your Care? Yes.    Actions: * If pain score is 4 or above: No action needed, pain <4.  1. Have you developed a fever since your procedure? no  2.   Have you had an respiratory symptoms (SOB or cough) since your procedure? no  3.   Have you tested positive for COVID 19 since your procedure no  4.   Have you had any family members/close contacts diagnosed with the COVID 19 since your procedure?  no   If yes to any of these questions please route to Joylene John, RN and Alphonsa Gin, Therapist, sports.

## 2019-09-11 DIAGNOSIS — J3089 Other allergic rhinitis: Secondary | ICD-10-CM | POA: Diagnosis not present

## 2019-09-11 DIAGNOSIS — J301 Allergic rhinitis due to pollen: Secondary | ICD-10-CM | POA: Diagnosis not present

## 2019-09-11 DIAGNOSIS — J3081 Allergic rhinitis due to animal (cat) (dog) hair and dander: Secondary | ICD-10-CM | POA: Diagnosis not present

## 2019-09-15 ENCOUNTER — Encounter: Payer: Self-pay | Admitting: Internal Medicine

## 2019-09-22 DIAGNOSIS — J301 Allergic rhinitis due to pollen: Secondary | ICD-10-CM | POA: Diagnosis not present

## 2019-09-22 DIAGNOSIS — J3089 Other allergic rhinitis: Secondary | ICD-10-CM | POA: Diagnosis not present

## 2019-09-22 DIAGNOSIS — J3081 Allergic rhinitis due to animal (cat) (dog) hair and dander: Secondary | ICD-10-CM | POA: Diagnosis not present

## 2019-09-24 DIAGNOSIS — F419 Anxiety disorder, unspecified: Secondary | ICD-10-CM | POA: Diagnosis not present

## 2019-09-24 DIAGNOSIS — F3342 Major depressive disorder, recurrent, in full remission: Secondary | ICD-10-CM | POA: Diagnosis not present

## 2019-09-24 DIAGNOSIS — F4010 Social phobia, unspecified: Secondary | ICD-10-CM | POA: Diagnosis not present

## 2019-10-09 DIAGNOSIS — J301 Allergic rhinitis due to pollen: Secondary | ICD-10-CM | POA: Diagnosis not present

## 2019-10-09 DIAGNOSIS — J3081 Allergic rhinitis due to animal (cat) (dog) hair and dander: Secondary | ICD-10-CM | POA: Diagnosis not present

## 2019-10-09 DIAGNOSIS — J3089 Other allergic rhinitis: Secondary | ICD-10-CM | POA: Diagnosis not present

## 2019-10-20 DIAGNOSIS — J3089 Other allergic rhinitis: Secondary | ICD-10-CM | POA: Diagnosis not present

## 2019-10-20 DIAGNOSIS — J301 Allergic rhinitis due to pollen: Secondary | ICD-10-CM | POA: Diagnosis not present

## 2019-10-20 DIAGNOSIS — J3081 Allergic rhinitis due to animal (cat) (dog) hair and dander: Secondary | ICD-10-CM | POA: Diagnosis not present

## 2019-10-29 DIAGNOSIS — J301 Allergic rhinitis due to pollen: Secondary | ICD-10-CM | POA: Diagnosis not present

## 2019-10-29 DIAGNOSIS — J3081 Allergic rhinitis due to animal (cat) (dog) hair and dander: Secondary | ICD-10-CM | POA: Diagnosis not present

## 2019-10-29 DIAGNOSIS — K219 Gastro-esophageal reflux disease without esophagitis: Secondary | ICD-10-CM | POA: Diagnosis not present

## 2019-10-29 DIAGNOSIS — J3089 Other allergic rhinitis: Secondary | ICD-10-CM | POA: Diagnosis not present

## 2019-11-06 DIAGNOSIS — J3089 Other allergic rhinitis: Secondary | ICD-10-CM | POA: Diagnosis not present

## 2019-11-06 DIAGNOSIS — J301 Allergic rhinitis due to pollen: Secondary | ICD-10-CM | POA: Diagnosis not present

## 2019-11-06 DIAGNOSIS — J3081 Allergic rhinitis due to animal (cat) (dog) hair and dander: Secondary | ICD-10-CM | POA: Diagnosis not present

## 2019-11-17 DIAGNOSIS — J301 Allergic rhinitis due to pollen: Secondary | ICD-10-CM | POA: Diagnosis not present

## 2019-11-17 DIAGNOSIS — L820 Inflamed seborrheic keratosis: Secondary | ICD-10-CM | POA: Diagnosis not present

## 2019-11-17 DIAGNOSIS — Z85828 Personal history of other malignant neoplasm of skin: Secondary | ICD-10-CM | POA: Diagnosis not present

## 2019-11-17 DIAGNOSIS — J3081 Allergic rhinitis due to animal (cat) (dog) hair and dander: Secondary | ICD-10-CM | POA: Diagnosis not present

## 2019-11-17 DIAGNOSIS — D225 Melanocytic nevi of trunk: Secondary | ICD-10-CM | POA: Diagnosis not present

## 2019-11-17 DIAGNOSIS — D2261 Melanocytic nevi of right upper limb, including shoulder: Secondary | ICD-10-CM | POA: Diagnosis not present

## 2019-11-17 DIAGNOSIS — J3089 Other allergic rhinitis: Secondary | ICD-10-CM | POA: Diagnosis not present

## 2019-11-17 DIAGNOSIS — B009 Herpesviral infection, unspecified: Secondary | ICD-10-CM | POA: Diagnosis not present

## 2019-11-17 DIAGNOSIS — Z808 Family history of malignant neoplasm of other organs or systems: Secondary | ICD-10-CM | POA: Diagnosis not present

## 2019-11-17 DIAGNOSIS — D485 Neoplasm of uncertain behavior of skin: Secondary | ICD-10-CM | POA: Diagnosis not present

## 2019-11-17 DIAGNOSIS — D2271 Melanocytic nevi of right lower limb, including hip: Secondary | ICD-10-CM | POA: Diagnosis not present

## 2019-11-17 DIAGNOSIS — Z23 Encounter for immunization: Secondary | ICD-10-CM | POA: Diagnosis not present

## 2019-11-17 DIAGNOSIS — Z86018 Personal history of other benign neoplasm: Secondary | ICD-10-CM | POA: Diagnosis not present

## 2019-12-10 DIAGNOSIS — Z1212 Encounter for screening for malignant neoplasm of rectum: Secondary | ICD-10-CM | POA: Diagnosis not present

## 2019-12-16 ENCOUNTER — Ambulatory Visit: Payer: Medicare Other | Attending: Internal Medicine

## 2019-12-16 DIAGNOSIS — Z20822 Contact with and (suspected) exposure to covid-19: Secondary | ICD-10-CM

## 2019-12-17 LAB — NOVEL CORONAVIRUS, NAA: SARS-CoV-2, NAA: NOT DETECTED

## 2019-12-22 DIAGNOSIS — C44619 Basal cell carcinoma of skin of left upper limb, including shoulder: Secondary | ICD-10-CM | POA: Diagnosis not present

## 2020-01-01 DIAGNOSIS — J301 Allergic rhinitis due to pollen: Secondary | ICD-10-CM | POA: Diagnosis not present

## 2020-01-01 DIAGNOSIS — J3081 Allergic rhinitis due to animal (cat) (dog) hair and dander: Secondary | ICD-10-CM | POA: Diagnosis not present

## 2020-01-01 DIAGNOSIS — J3089 Other allergic rhinitis: Secondary | ICD-10-CM | POA: Diagnosis not present

## 2020-01-09 ENCOUNTER — Ambulatory Visit: Payer: Medicare Other | Attending: Internal Medicine

## 2020-01-09 DIAGNOSIS — Z23 Encounter for immunization: Secondary | ICD-10-CM | POA: Insufficient documentation

## 2020-01-09 NOTE — Progress Notes (Signed)
   Covid-19 Vaccination Clinic  Name:  Alicia [name]    MRN: [id number] DOB: 05-Nov-1946  01/09/2020  [name] was observed post Covid-19 immunization for 15 minutes without incidence. She was provided with Vaccine Information Sheet and instruction to access the V-Safe system.   [name] was instructed to call 911 with any severe reactions post vaccine: Marland Kitchen Difficulty breathing  . Swelling of your face and throat  . A fast heartbeat  . A bad rash all over your body  . Dizziness and weakness    Immunizations Administered    Name Date Dose VIS Date Route   Pfizer COVID-19 Vaccine 01/09/2020  2:49 PM 0.3 mL 11/27/2019 Intramuscular   Manufacturer: [place]   Lot: [id number]   [place]: [id number]

## 2020-01-12 DIAGNOSIS — J3081 Allergic rhinitis due to animal (cat) (dog) hair and dander: Secondary | ICD-10-CM | POA: Diagnosis not present

## 2020-01-12 DIAGNOSIS — J3089 Other allergic rhinitis: Secondary | ICD-10-CM | POA: Diagnosis not present

## 2020-01-12 DIAGNOSIS — J301 Allergic rhinitis due to pollen: Secondary | ICD-10-CM | POA: Diagnosis not present

## 2020-01-25 ENCOUNTER — Ambulatory Visit: Payer: Medicare Other

## 2020-01-26 DIAGNOSIS — J301 Allergic rhinitis due to pollen: Secondary | ICD-10-CM | POA: Diagnosis not present

## 2020-01-26 DIAGNOSIS — J3081 Allergic rhinitis due to animal (cat) (dog) hair and dander: Secondary | ICD-10-CM | POA: Diagnosis not present

## 2020-01-26 DIAGNOSIS — J3089 Other allergic rhinitis: Secondary | ICD-10-CM | POA: Diagnosis not present

## 2020-01-31 ENCOUNTER — Ambulatory Visit: Payer: Medicare Other | Attending: Internal Medicine

## 2020-01-31 DIAGNOSIS — Z23 Encounter for immunization: Secondary | ICD-10-CM

## 2020-01-31 NOTE — Progress Notes (Signed)
   Covid-19 Vaccination Clinic  Name:  MAKYNLI VASICEK    MRN: [id number] DOB: Jun 12, 1946  01/31/2020  Ms. Hartel was observed post Covid-19 immunization for 15 minutes without incidence. She was provided with Vaccine Information Sheet and instruction to access the V-Safe system.   Ms. Besch was instructed to call 911 with any severe reactions post vaccine: Marland Kitchen Difficulty breathing  . Swelling of your face and throat  . A fast heartbeat  . A bad rash all over your body  . Dizziness and weakness    Immunizations Administered    Name Date Dose VIS Date Route   Pfizer COVID-19 Vaccine 01/31/2020 11:34 AM 0.3 mL 11/27/2019 Intramuscular   Manufacturer: [place]   Lot: [id number]   [place]: [id number]

## 2020-02-09 DIAGNOSIS — J301 Allergic rhinitis due to pollen: Secondary | ICD-10-CM | POA: Diagnosis not present

## 2020-02-09 DIAGNOSIS — J3089 Other allergic rhinitis: Secondary | ICD-10-CM | POA: Diagnosis not present

## 2020-02-09 DIAGNOSIS — J3081 Allergic rhinitis due to animal (cat) (dog) hair and dander: Secondary | ICD-10-CM | POA: Diagnosis not present

## 2020-02-11 DIAGNOSIS — M8589 Other specified disorders of bone density and structure, multiple sites: Secondary | ICD-10-CM | POA: Diagnosis not present

## 2020-02-23 DIAGNOSIS — J3089 Other allergic rhinitis: Secondary | ICD-10-CM | POA: Diagnosis not present

## 2020-02-23 DIAGNOSIS — J3081 Allergic rhinitis due to animal (cat) (dog) hair and dander: Secondary | ICD-10-CM | POA: Diagnosis not present

## 2020-02-23 DIAGNOSIS — J301 Allergic rhinitis due to pollen: Secondary | ICD-10-CM | POA: Diagnosis not present

## 2020-03-08 DIAGNOSIS — J301 Allergic rhinitis due to pollen: Secondary | ICD-10-CM | POA: Diagnosis not present

## 2020-03-08 DIAGNOSIS — J3089 Other allergic rhinitis: Secondary | ICD-10-CM | POA: Diagnosis not present

## 2020-03-08 DIAGNOSIS — J3081 Allergic rhinitis due to animal (cat) (dog) hair and dander: Secondary | ICD-10-CM | POA: Diagnosis not present

## 2020-03-14 DIAGNOSIS — J3089 Other allergic rhinitis: Secondary | ICD-10-CM | POA: Diagnosis not present

## 2020-03-14 DIAGNOSIS — J3081 Allergic rhinitis due to animal (cat) (dog) hair and dander: Secondary | ICD-10-CM | POA: Diagnosis not present

## 2020-03-25 DIAGNOSIS — J3081 Allergic rhinitis due to animal (cat) (dog) hair and dander: Secondary | ICD-10-CM | POA: Diagnosis not present

## 2020-03-25 DIAGNOSIS — J301 Allergic rhinitis due to pollen: Secondary | ICD-10-CM | POA: Diagnosis not present

## 2020-03-25 DIAGNOSIS — J300 Vasomotor rhinitis: Secondary | ICD-10-CM | POA: Diagnosis not present

## 2020-03-25 DIAGNOSIS — J3089 Other allergic rhinitis: Secondary | ICD-10-CM | POA: Diagnosis not present

## 2020-03-25 DIAGNOSIS — H1045 Other chronic allergic conjunctivitis: Secondary | ICD-10-CM | POA: Diagnosis not present

## 2020-04-19 DIAGNOSIS — J3089 Other allergic rhinitis: Secondary | ICD-10-CM | POA: Diagnosis not present

## 2020-04-19 DIAGNOSIS — J301 Allergic rhinitis due to pollen: Secondary | ICD-10-CM | POA: Diagnosis not present

## 2020-04-19 DIAGNOSIS — J3081 Allergic rhinitis due to animal (cat) (dog) hair and dander: Secondary | ICD-10-CM | POA: Diagnosis not present

## 2020-04-21 DIAGNOSIS — J301 Allergic rhinitis due to pollen: Secondary | ICD-10-CM | POA: Diagnosis not present

## 2020-04-21 DIAGNOSIS — J3089 Other allergic rhinitis: Secondary | ICD-10-CM | POA: Diagnosis not present

## 2020-04-21 DIAGNOSIS — J3081 Allergic rhinitis due to animal (cat) (dog) hair and dander: Secondary | ICD-10-CM | POA: Diagnosis not present

## 2020-04-26 DIAGNOSIS — J3081 Allergic rhinitis due to animal (cat) (dog) hair and dander: Secondary | ICD-10-CM | POA: Diagnosis not present

## 2020-04-26 DIAGNOSIS — J301 Allergic rhinitis due to pollen: Secondary | ICD-10-CM | POA: Diagnosis not present

## 2020-04-26 DIAGNOSIS — J3089 Other allergic rhinitis: Secondary | ICD-10-CM | POA: Diagnosis not present

## 2020-04-29 DIAGNOSIS — J3081 Allergic rhinitis due to animal (cat) (dog) hair and dander: Secondary | ICD-10-CM | POA: Diagnosis not present

## 2020-04-29 DIAGNOSIS — J301 Allergic rhinitis due to pollen: Secondary | ICD-10-CM | POA: Diagnosis not present

## 2020-04-29 DIAGNOSIS — J3089 Other allergic rhinitis: Secondary | ICD-10-CM | POA: Diagnosis not present

## 2020-05-03 DIAGNOSIS — J301 Allergic rhinitis due to pollen: Secondary | ICD-10-CM | POA: Diagnosis not present

## 2020-05-03 DIAGNOSIS — J3089 Other allergic rhinitis: Secondary | ICD-10-CM | POA: Diagnosis not present

## 2020-05-03 DIAGNOSIS — J3081 Allergic rhinitis due to animal (cat) (dog) hair and dander: Secondary | ICD-10-CM | POA: Diagnosis not present

## 2020-05-20 DIAGNOSIS — J3089 Other allergic rhinitis: Secondary | ICD-10-CM | POA: Diagnosis not present

## 2020-05-20 DIAGNOSIS — J301 Allergic rhinitis due to pollen: Secondary | ICD-10-CM | POA: Diagnosis not present

## 2020-06-28 DIAGNOSIS — J3081 Allergic rhinitis due to animal (cat) (dog) hair and dander: Secondary | ICD-10-CM | POA: Diagnosis not present

## 2020-06-28 DIAGNOSIS — J3089 Other allergic rhinitis: Secondary | ICD-10-CM | POA: Diagnosis not present

## 2020-06-28 DIAGNOSIS — J301 Allergic rhinitis due to pollen: Secondary | ICD-10-CM | POA: Diagnosis not present

## 2020-07-05 DIAGNOSIS — J3089 Other allergic rhinitis: Secondary | ICD-10-CM | POA: Diagnosis not present

## 2020-07-05 DIAGNOSIS — J301 Allergic rhinitis due to pollen: Secondary | ICD-10-CM | POA: Diagnosis not present

## 2020-07-05 DIAGNOSIS — J3081 Allergic rhinitis due to animal (cat) (dog) hair and dander: Secondary | ICD-10-CM | POA: Diagnosis not present

## 2020-07-12 DIAGNOSIS — J3089 Other allergic rhinitis: Secondary | ICD-10-CM | POA: Diagnosis not present

## 2020-07-12 DIAGNOSIS — J3081 Allergic rhinitis due to animal (cat) (dog) hair and dander: Secondary | ICD-10-CM | POA: Diagnosis not present

## 2020-07-12 DIAGNOSIS — J301 Allergic rhinitis due to pollen: Secondary | ICD-10-CM | POA: Diagnosis not present

## 2020-07-19 DIAGNOSIS — J3089 Other allergic rhinitis: Secondary | ICD-10-CM | POA: Diagnosis not present

## 2020-07-19 DIAGNOSIS — J301 Allergic rhinitis due to pollen: Secondary | ICD-10-CM | POA: Diagnosis not present

## 2020-07-19 DIAGNOSIS — J3081 Allergic rhinitis due to animal (cat) (dog) hair and dander: Secondary | ICD-10-CM | POA: Diagnosis not present

## 2020-07-26 DIAGNOSIS — J3081 Allergic rhinitis due to animal (cat) (dog) hair and dander: Secondary | ICD-10-CM | POA: Diagnosis not present

## 2020-07-26 DIAGNOSIS — J301 Allergic rhinitis due to pollen: Secondary | ICD-10-CM | POA: Diagnosis not present

## 2020-07-26 DIAGNOSIS — J3089 Other allergic rhinitis: Secondary | ICD-10-CM | POA: Diagnosis not present

## 2020-08-02 DIAGNOSIS — J3089 Other allergic rhinitis: Secondary | ICD-10-CM | POA: Diagnosis not present

## 2020-08-02 DIAGNOSIS — J301 Allergic rhinitis due to pollen: Secondary | ICD-10-CM | POA: Diagnosis not present

## 2020-08-02 DIAGNOSIS — J3081 Allergic rhinitis due to animal (cat) (dog) hair and dander: Secondary | ICD-10-CM | POA: Diagnosis not present

## 2020-08-08 DIAGNOSIS — J3081 Allergic rhinitis due to animal (cat) (dog) hair and dander: Secondary | ICD-10-CM | POA: Diagnosis not present

## 2020-08-08 DIAGNOSIS — J301 Allergic rhinitis due to pollen: Secondary | ICD-10-CM | POA: Diagnosis not present

## 2020-08-08 DIAGNOSIS — J3089 Other allergic rhinitis: Secondary | ICD-10-CM | POA: Diagnosis not present

## 2020-08-10 DIAGNOSIS — J3089 Other allergic rhinitis: Secondary | ICD-10-CM | POA: Diagnosis not present

## 2020-08-10 DIAGNOSIS — J301 Allergic rhinitis due to pollen: Secondary | ICD-10-CM | POA: Diagnosis not present

## 2020-08-10 DIAGNOSIS — J3081 Allergic rhinitis due to animal (cat) (dog) hair and dander: Secondary | ICD-10-CM | POA: Diagnosis not present

## 2020-08-11 ENCOUNTER — Other Ambulatory Visit: Payer: Self-pay | Admitting: Internal Medicine

## 2020-08-11 DIAGNOSIS — Z1231 Encounter for screening mammogram for malignant neoplasm of breast: Secondary | ICD-10-CM

## 2020-08-16 DIAGNOSIS — J3089 Other allergic rhinitis: Secondary | ICD-10-CM | POA: Diagnosis not present

## 2020-08-16 DIAGNOSIS — J301 Allergic rhinitis due to pollen: Secondary | ICD-10-CM | POA: Diagnosis not present

## 2020-08-16 DIAGNOSIS — J3081 Allergic rhinitis due to animal (cat) (dog) hair and dander: Secondary | ICD-10-CM | POA: Diagnosis not present

## 2020-08-18 DIAGNOSIS — L820 Inflamed seborrheic keratosis: Secondary | ICD-10-CM | POA: Diagnosis not present

## 2020-08-26 DIAGNOSIS — J301 Allergic rhinitis due to pollen: Secondary | ICD-10-CM | POA: Diagnosis not present

## 2020-08-26 DIAGNOSIS — J3089 Other allergic rhinitis: Secondary | ICD-10-CM | POA: Diagnosis not present

## 2020-08-26 DIAGNOSIS — J3081 Allergic rhinitis due to animal (cat) (dog) hair and dander: Secondary | ICD-10-CM | POA: Diagnosis not present

## 2020-09-08 ENCOUNTER — Other Ambulatory Visit: Payer: Self-pay

## 2020-09-08 ENCOUNTER — Ambulatory Visit
Admission: RE | Admit: 2020-09-08 | Discharge: 2020-09-08 | Disposition: A | Discharge status: Home / Self Care | Payer: Medicare Other | Source: Ambulatory Visit | Attending: Internal Medicine | Admitting: Internal Medicine

## 2020-09-08 DIAGNOSIS — Z1231 Encounter for screening mammogram for malignant neoplasm of breast: Secondary | ICD-10-CM | POA: Diagnosis not present

## 2020-09-14 DIAGNOSIS — J301 Allergic rhinitis due to pollen: Secondary | ICD-10-CM | POA: Diagnosis not present

## 2020-09-14 DIAGNOSIS — J3089 Other allergic rhinitis: Secondary | ICD-10-CM | POA: Diagnosis not present

## 2020-09-14 DIAGNOSIS — J3081 Allergic rhinitis due to animal (cat) (dog) hair and dander: Secondary | ICD-10-CM | POA: Diagnosis not present

## 2020-09-16 DIAGNOSIS — J3081 Allergic rhinitis due to animal (cat) (dog) hair and dander: Secondary | ICD-10-CM | POA: Diagnosis not present

## 2020-09-16 DIAGNOSIS — J301 Allergic rhinitis due to pollen: Secondary | ICD-10-CM | POA: Diagnosis not present

## 2020-09-16 DIAGNOSIS — J3089 Other allergic rhinitis: Secondary | ICD-10-CM | POA: Diagnosis not present

## 2020-09-20 DIAGNOSIS — J3089 Other allergic rhinitis: Secondary | ICD-10-CM | POA: Diagnosis not present

## 2020-09-20 DIAGNOSIS — J3081 Allergic rhinitis due to animal (cat) (dog) hair and dander: Secondary | ICD-10-CM | POA: Diagnosis not present

## 2020-09-20 DIAGNOSIS — J301 Allergic rhinitis due to pollen: Secondary | ICD-10-CM | POA: Diagnosis not present

## 2020-09-22 DIAGNOSIS — F419 Anxiety disorder, unspecified: Secondary | ICD-10-CM | POA: Diagnosis not present

## 2020-09-22 DIAGNOSIS — Z634 Disappearance and death of family member: Secondary | ICD-10-CM | POA: Diagnosis not present

## 2020-09-22 DIAGNOSIS — F4010 Social phobia, unspecified: Secondary | ICD-10-CM | POA: Diagnosis not present

## 2020-09-22 DIAGNOSIS — F3342 Major depressive disorder, recurrent, in full remission: Secondary | ICD-10-CM | POA: Diagnosis not present

## 2020-09-27 DIAGNOSIS — Z23 Encounter for immunization: Secondary | ICD-10-CM | POA: Diagnosis not present

## 2020-10-04 DIAGNOSIS — J3081 Allergic rhinitis due to animal (cat) (dog) hair and dander: Secondary | ICD-10-CM | POA: Diagnosis not present

## 2020-10-04 DIAGNOSIS — J3089 Other allergic rhinitis: Secondary | ICD-10-CM | POA: Diagnosis not present

## 2020-10-04 DIAGNOSIS — J301 Allergic rhinitis due to pollen: Secondary | ICD-10-CM | POA: Diagnosis not present

## 2020-10-11 DIAGNOSIS — J3081 Allergic rhinitis due to animal (cat) (dog) hair and dander: Secondary | ICD-10-CM | POA: Diagnosis not present

## 2020-10-11 DIAGNOSIS — J301 Allergic rhinitis due to pollen: Secondary | ICD-10-CM | POA: Diagnosis not present

## 2020-10-11 DIAGNOSIS — J3089 Other allergic rhinitis: Secondary | ICD-10-CM | POA: Diagnosis not present

## 2020-10-18 DIAGNOSIS — J3089 Other allergic rhinitis: Secondary | ICD-10-CM | POA: Diagnosis not present

## 2020-10-18 DIAGNOSIS — J301 Allergic rhinitis due to pollen: Secondary | ICD-10-CM | POA: Diagnosis not present

## 2020-10-18 DIAGNOSIS — M545 Low back pain, unspecified: Secondary | ICD-10-CM | POA: Diagnosis not present

## 2020-10-18 DIAGNOSIS — J3081 Allergic rhinitis due to animal (cat) (dog) hair and dander: Secondary | ICD-10-CM | POA: Diagnosis not present

## 2020-10-25 DIAGNOSIS — J3089 Other allergic rhinitis: Secondary | ICD-10-CM | POA: Diagnosis not present

## 2020-10-25 DIAGNOSIS — J301 Allergic rhinitis due to pollen: Secondary | ICD-10-CM | POA: Diagnosis not present

## 2020-10-25 DIAGNOSIS — J3081 Allergic rhinitis due to animal (cat) (dog) hair and dander: Secondary | ICD-10-CM | POA: Diagnosis not present

## 2020-10-28 DIAGNOSIS — J301 Allergic rhinitis due to pollen: Secondary | ICD-10-CM | POA: Diagnosis not present

## 2020-10-28 DIAGNOSIS — J3081 Allergic rhinitis due to animal (cat) (dog) hair and dander: Secondary | ICD-10-CM | POA: Diagnosis not present

## 2020-10-28 DIAGNOSIS — J3089 Other allergic rhinitis: Secondary | ICD-10-CM | POA: Diagnosis not present

## 2020-11-01 DIAGNOSIS — J3081 Allergic rhinitis due to animal (cat) (dog) hair and dander: Secondary | ICD-10-CM | POA: Diagnosis not present

## 2020-11-01 DIAGNOSIS — J3089 Other allergic rhinitis: Secondary | ICD-10-CM | POA: Diagnosis not present

## 2020-11-01 DIAGNOSIS — J301 Allergic rhinitis due to pollen: Secondary | ICD-10-CM | POA: Diagnosis not present

## 2020-11-15 DIAGNOSIS — J3089 Other allergic rhinitis: Secondary | ICD-10-CM | POA: Diagnosis not present

## 2020-11-15 DIAGNOSIS — J3081 Allergic rhinitis due to animal (cat) (dog) hair and dander: Secondary | ICD-10-CM | POA: Diagnosis not present

## 2020-11-15 DIAGNOSIS — J301 Allergic rhinitis due to pollen: Secondary | ICD-10-CM | POA: Diagnosis not present

## 2020-11-18 DIAGNOSIS — J3081 Allergic rhinitis due to animal (cat) (dog) hair and dander: Secondary | ICD-10-CM | POA: Diagnosis not present

## 2020-11-18 DIAGNOSIS — J301 Allergic rhinitis due to pollen: Secondary | ICD-10-CM | POA: Diagnosis not present

## 2020-11-18 DIAGNOSIS — J3089 Other allergic rhinitis: Secondary | ICD-10-CM | POA: Diagnosis not present

## 2020-11-22 DIAGNOSIS — J301 Allergic rhinitis due to pollen: Secondary | ICD-10-CM | POA: Diagnosis not present

## 2020-11-22 DIAGNOSIS — J3089 Other allergic rhinitis: Secondary | ICD-10-CM | POA: Diagnosis not present

## 2020-11-22 DIAGNOSIS — J3081 Allergic rhinitis due to animal (cat) (dog) hair and dander: Secondary | ICD-10-CM | POA: Diagnosis not present

## 2020-11-29 DIAGNOSIS — J3081 Allergic rhinitis due to animal (cat) (dog) hair and dander: Secondary | ICD-10-CM | POA: Diagnosis not present

## 2020-11-29 DIAGNOSIS — J301 Allergic rhinitis due to pollen: Secondary | ICD-10-CM | POA: Diagnosis not present

## 2020-11-29 DIAGNOSIS — J3089 Other allergic rhinitis: Secondary | ICD-10-CM | POA: Diagnosis not present

## 2020-12-02 DIAGNOSIS — L570 Actinic keratosis: Secondary | ICD-10-CM | POA: Diagnosis not present

## 2020-12-02 DIAGNOSIS — D485 Neoplasm of uncertain behavior of skin: Secondary | ICD-10-CM | POA: Diagnosis not present

## 2020-12-02 DIAGNOSIS — L578 Other skin changes due to chronic exposure to nonionizing radiation: Secondary | ICD-10-CM | POA: Diagnosis not present

## 2020-12-02 DIAGNOSIS — Z85828 Personal history of other malignant neoplasm of skin: Secondary | ICD-10-CM | POA: Diagnosis not present

## 2020-12-02 DIAGNOSIS — D045 Carcinoma in situ of skin of trunk: Secondary | ICD-10-CM | POA: Diagnosis not present

## 2020-12-02 DIAGNOSIS — D2261 Melanocytic nevi of right upper limb, including shoulder: Secondary | ICD-10-CM | POA: Diagnosis not present

## 2020-12-02 DIAGNOSIS — L820 Inflamed seborrheic keratosis: Secondary | ICD-10-CM | POA: Diagnosis not present

## 2020-12-02 DIAGNOSIS — L821 Other seborrheic keratosis: Secondary | ICD-10-CM | POA: Diagnosis not present

## 2020-12-02 DIAGNOSIS — Z86018 Personal history of other benign neoplasm: Secondary | ICD-10-CM | POA: Diagnosis not present

## 2020-12-02 DIAGNOSIS — D225 Melanocytic nevi of trunk: Secondary | ICD-10-CM | POA: Diagnosis not present

## 2020-12-02 DIAGNOSIS — D2271 Melanocytic nevi of right lower limb, including hip: Secondary | ICD-10-CM | POA: Diagnosis not present

## 2020-12-02 DIAGNOSIS — Z808 Family history of malignant neoplasm of other organs or systems: Secondary | ICD-10-CM | POA: Diagnosis not present

## 2020-12-05 DIAGNOSIS — J301 Allergic rhinitis due to pollen: Secondary | ICD-10-CM | POA: Diagnosis not present

## 2020-12-05 DIAGNOSIS — J3081 Allergic rhinitis due to animal (cat) (dog) hair and dander: Secondary | ICD-10-CM | POA: Diagnosis not present

## 2020-12-05 DIAGNOSIS — J3089 Other allergic rhinitis: Secondary | ICD-10-CM | POA: Diagnosis not present

## 2020-12-13 DIAGNOSIS — J301 Allergic rhinitis due to pollen: Secondary | ICD-10-CM | POA: Diagnosis not present

## 2020-12-13 DIAGNOSIS — J3081 Allergic rhinitis due to animal (cat) (dog) hair and dander: Secondary | ICD-10-CM | POA: Diagnosis not present

## 2020-12-13 DIAGNOSIS — J3089 Other allergic rhinitis: Secondary | ICD-10-CM | POA: Diagnosis not present

## 2020-12-22 DIAGNOSIS — E785 Hyperlipidemia, unspecified: Secondary | ICD-10-CM | POA: Diagnosis not present

## 2020-12-22 DIAGNOSIS — R7301 Impaired fasting glucose: Secondary | ICD-10-CM | POA: Diagnosis not present

## 2020-12-22 DIAGNOSIS — E559 Vitamin D deficiency, unspecified: Secondary | ICD-10-CM | POA: Diagnosis not present

## 2020-12-27 DIAGNOSIS — J3081 Allergic rhinitis due to animal (cat) (dog) hair and dander: Secondary | ICD-10-CM | POA: Diagnosis not present

## 2020-12-27 DIAGNOSIS — J3089 Other allergic rhinitis: Secondary | ICD-10-CM | POA: Diagnosis not present

## 2020-12-27 DIAGNOSIS — J301 Allergic rhinitis due to pollen: Secondary | ICD-10-CM | POA: Diagnosis not present

## 2020-12-29 DIAGNOSIS — M545 Low back pain, unspecified: Secondary | ICD-10-CM | POA: Diagnosis not present

## 2020-12-29 DIAGNOSIS — H9319 Tinnitus, unspecified ear: Secondary | ICD-10-CM | POA: Diagnosis not present

## 2020-12-29 DIAGNOSIS — I471 Supraventricular tachycardia: Secondary | ICD-10-CM | POA: Diagnosis not present

## 2020-12-29 DIAGNOSIS — Z85828 Personal history of other malignant neoplasm of skin: Secondary | ICD-10-CM | POA: Diagnosis not present

## 2020-12-29 DIAGNOSIS — Z1331 Encounter for screening for depression: Secondary | ICD-10-CM | POA: Diagnosis not present

## 2020-12-29 DIAGNOSIS — Z1212 Encounter for screening for malignant neoplasm of rectum: Secondary | ICD-10-CM | POA: Diagnosis not present

## 2020-12-29 DIAGNOSIS — Z0000 Encounter for general adult medical examination without abnormal findings: Secondary | ICD-10-CM | POA: Diagnosis not present

## 2020-12-29 DIAGNOSIS — M8580 Other specified disorders of bone density and structure, unspecified site: Secondary | ICD-10-CM | POA: Diagnosis not present

## 2020-12-29 DIAGNOSIS — E559 Vitamin D deficiency, unspecified: Secondary | ICD-10-CM | POA: Diagnosis not present

## 2020-12-29 DIAGNOSIS — R7301 Impaired fasting glucose: Secondary | ICD-10-CM | POA: Diagnosis not present

## 2020-12-29 DIAGNOSIS — E785 Hyperlipidemia, unspecified: Secondary | ICD-10-CM | POA: Diagnosis not present

## 2020-12-29 DIAGNOSIS — J309 Allergic rhinitis, unspecified: Secondary | ICD-10-CM | POA: Diagnosis not present

## 2020-12-29 DIAGNOSIS — R82998 Other abnormal findings in urine: Secondary | ICD-10-CM | POA: Diagnosis not present

## 2020-12-29 DIAGNOSIS — K219 Gastro-esophageal reflux disease without esophagitis: Secondary | ICD-10-CM | POA: Diagnosis not present

## 2021-01-03 DIAGNOSIS — J3089 Other allergic rhinitis: Secondary | ICD-10-CM | POA: Diagnosis not present

## 2021-01-03 DIAGNOSIS — J301 Allergic rhinitis due to pollen: Secondary | ICD-10-CM | POA: Diagnosis not present

## 2021-01-03 DIAGNOSIS — J3081 Allergic rhinitis due to animal (cat) (dog) hair and dander: Secondary | ICD-10-CM | POA: Diagnosis not present

## 2021-01-06 DIAGNOSIS — J3089 Other allergic rhinitis: Secondary | ICD-10-CM | POA: Diagnosis not present

## 2021-01-06 DIAGNOSIS — J301 Allergic rhinitis due to pollen: Secondary | ICD-10-CM | POA: Diagnosis not present

## 2021-01-06 DIAGNOSIS — J3081 Allergic rhinitis due to animal (cat) (dog) hair and dander: Secondary | ICD-10-CM | POA: Diagnosis not present

## 2021-01-10 DIAGNOSIS — J3089 Other allergic rhinitis: Secondary | ICD-10-CM | POA: Diagnosis not present

## 2021-01-10 DIAGNOSIS — J3081 Allergic rhinitis due to animal (cat) (dog) hair and dander: Secondary | ICD-10-CM | POA: Diagnosis not present

## 2021-01-10 DIAGNOSIS — J301 Allergic rhinitis due to pollen: Secondary | ICD-10-CM | POA: Diagnosis not present

## 2021-01-17 DIAGNOSIS — J3089 Other allergic rhinitis: Secondary | ICD-10-CM | POA: Diagnosis not present

## 2021-01-17 DIAGNOSIS — J3081 Allergic rhinitis due to animal (cat) (dog) hair and dander: Secondary | ICD-10-CM | POA: Diagnosis not present

## 2021-01-17 DIAGNOSIS — J301 Allergic rhinitis due to pollen: Secondary | ICD-10-CM | POA: Diagnosis not present

## 2021-01-25 DIAGNOSIS — J3081 Allergic rhinitis due to animal (cat) (dog) hair and dander: Secondary | ICD-10-CM | POA: Diagnosis not present

## 2021-01-25 DIAGNOSIS — J301 Allergic rhinitis due to pollen: Secondary | ICD-10-CM | POA: Diagnosis not present

## 2021-01-25 DIAGNOSIS — J3089 Other allergic rhinitis: Secondary | ICD-10-CM | POA: Diagnosis not present

## 2021-02-01 DIAGNOSIS — J3081 Allergic rhinitis due to animal (cat) (dog) hair and dander: Secondary | ICD-10-CM | POA: Diagnosis not present

## 2021-02-01 DIAGNOSIS — J301 Allergic rhinitis due to pollen: Secondary | ICD-10-CM | POA: Diagnosis not present

## 2021-02-01 DIAGNOSIS — J3089 Other allergic rhinitis: Secondary | ICD-10-CM | POA: Diagnosis not present

## 2021-02-07 DIAGNOSIS — J301 Allergic rhinitis due to pollen: Secondary | ICD-10-CM | POA: Diagnosis not present

## 2021-02-07 DIAGNOSIS — J3081 Allergic rhinitis due to animal (cat) (dog) hair and dander: Secondary | ICD-10-CM | POA: Diagnosis not present

## 2021-02-07 DIAGNOSIS — J3089 Other allergic rhinitis: Secondary | ICD-10-CM | POA: Diagnosis not present

## 2021-02-10 DIAGNOSIS — J3089 Other allergic rhinitis: Secondary | ICD-10-CM | POA: Diagnosis not present

## 2021-02-10 DIAGNOSIS — J301 Allergic rhinitis due to pollen: Secondary | ICD-10-CM | POA: Diagnosis not present

## 2021-02-10 DIAGNOSIS — J3081 Allergic rhinitis due to animal (cat) (dog) hair and dander: Secondary | ICD-10-CM | POA: Diagnosis not present

## 2021-02-14 DIAGNOSIS — J3089 Other allergic rhinitis: Secondary | ICD-10-CM | POA: Diagnosis not present

## 2021-02-14 DIAGNOSIS — J3081 Allergic rhinitis due to animal (cat) (dog) hair and dander: Secondary | ICD-10-CM | POA: Diagnosis not present

## 2021-02-14 DIAGNOSIS — J301 Allergic rhinitis due to pollen: Secondary | ICD-10-CM | POA: Diagnosis not present

## 2021-02-21 DIAGNOSIS — J3089 Other allergic rhinitis: Secondary | ICD-10-CM | POA: Diagnosis not present

## 2021-02-21 DIAGNOSIS — J301 Allergic rhinitis due to pollen: Secondary | ICD-10-CM | POA: Diagnosis not present

## 2021-02-21 DIAGNOSIS — J3081 Allergic rhinitis due to animal (cat) (dog) hair and dander: Secondary | ICD-10-CM | POA: Diagnosis not present

## 2021-02-23 DIAGNOSIS — L821 Other seborrheic keratosis: Secondary | ICD-10-CM | POA: Diagnosis not present

## 2021-02-23 DIAGNOSIS — D045 Carcinoma in situ of skin of trunk: Secondary | ICD-10-CM | POA: Diagnosis not present

## 2021-02-23 DIAGNOSIS — L570 Actinic keratosis: Secondary | ICD-10-CM | POA: Diagnosis not present

## 2021-02-27 DIAGNOSIS — J3089 Other allergic rhinitis: Secondary | ICD-10-CM | POA: Diagnosis not present

## 2021-02-27 DIAGNOSIS — J301 Allergic rhinitis due to pollen: Secondary | ICD-10-CM | POA: Diagnosis not present

## 2021-02-27 DIAGNOSIS — J3081 Allergic rhinitis due to animal (cat) (dog) hair and dander: Secondary | ICD-10-CM | POA: Diagnosis not present

## 2021-03-09 DIAGNOSIS — J301 Allergic rhinitis due to pollen: Secondary | ICD-10-CM | POA: Diagnosis not present

## 2021-03-09 DIAGNOSIS — J3089 Other allergic rhinitis: Secondary | ICD-10-CM | POA: Diagnosis not present

## 2021-03-09 DIAGNOSIS — J3081 Allergic rhinitis due to animal (cat) (dog) hair and dander: Secondary | ICD-10-CM | POA: Diagnosis not present

## 2021-03-15 DIAGNOSIS — J3089 Other allergic rhinitis: Secondary | ICD-10-CM | POA: Diagnosis not present

## 2021-03-15 DIAGNOSIS — J3081 Allergic rhinitis due to animal (cat) (dog) hair and dander: Secondary | ICD-10-CM | POA: Diagnosis not present

## 2021-03-15 DIAGNOSIS — J301 Allergic rhinitis due to pollen: Secondary | ICD-10-CM | POA: Diagnosis not present

## 2021-03-23 DIAGNOSIS — J301 Allergic rhinitis due to pollen: Secondary | ICD-10-CM | POA: Diagnosis not present

## 2021-03-23 DIAGNOSIS — J3089 Other allergic rhinitis: Secondary | ICD-10-CM | POA: Diagnosis not present

## 2021-03-23 DIAGNOSIS — J3081 Allergic rhinitis due to animal (cat) (dog) hair and dander: Secondary | ICD-10-CM | POA: Diagnosis not present

## 2021-03-28 DIAGNOSIS — J301 Allergic rhinitis due to pollen: Secondary | ICD-10-CM | POA: Diagnosis not present

## 2021-03-28 DIAGNOSIS — J3081 Allergic rhinitis due to animal (cat) (dog) hair and dander: Secondary | ICD-10-CM | POA: Diagnosis not present

## 2021-03-28 DIAGNOSIS — J3089 Other allergic rhinitis: Secondary | ICD-10-CM | POA: Diagnosis not present

## 2021-04-06 DIAGNOSIS — H10413 Chronic giant papillary conjunctivitis, bilateral: Secondary | ICD-10-CM | POA: Diagnosis not present

## 2021-04-11 DIAGNOSIS — J3089 Other allergic rhinitis: Secondary | ICD-10-CM | POA: Diagnosis not present

## 2021-04-11 DIAGNOSIS — J3081 Allergic rhinitis due to animal (cat) (dog) hair and dander: Secondary | ICD-10-CM | POA: Diagnosis not present

## 2021-04-11 DIAGNOSIS — J301 Allergic rhinitis due to pollen: Secondary | ICD-10-CM | POA: Diagnosis not present

## 2021-04-18 DIAGNOSIS — J301 Allergic rhinitis due to pollen: Secondary | ICD-10-CM | POA: Diagnosis not present

## 2021-04-18 DIAGNOSIS — J3089 Other allergic rhinitis: Secondary | ICD-10-CM | POA: Diagnosis not present

## 2021-04-18 DIAGNOSIS — J3081 Allergic rhinitis due to animal (cat) (dog) hair and dander: Secondary | ICD-10-CM | POA: Diagnosis not present

## 2021-04-26 DIAGNOSIS — J301 Allergic rhinitis due to pollen: Secondary | ICD-10-CM | POA: Diagnosis not present

## 2021-04-26 DIAGNOSIS — J3089 Other allergic rhinitis: Secondary | ICD-10-CM | POA: Diagnosis not present

## 2021-04-26 DIAGNOSIS — J3081 Allergic rhinitis due to animal (cat) (dog) hair and dander: Secondary | ICD-10-CM | POA: Diagnosis not present

## 2021-05-09 DIAGNOSIS — J301 Allergic rhinitis due to pollen: Secondary | ICD-10-CM | POA: Diagnosis not present

## 2021-05-09 DIAGNOSIS — J3081 Allergic rhinitis due to animal (cat) (dog) hair and dander: Secondary | ICD-10-CM | POA: Diagnosis not present

## 2021-05-09 DIAGNOSIS — J3089 Other allergic rhinitis: Secondary | ICD-10-CM | POA: Diagnosis not present

## 2021-05-19 DIAGNOSIS — J3081 Allergic rhinitis due to animal (cat) (dog) hair and dander: Secondary | ICD-10-CM | POA: Diagnosis not present

## 2021-05-19 DIAGNOSIS — J301 Allergic rhinitis due to pollen: Secondary | ICD-10-CM | POA: Diagnosis not present

## 2021-05-19 DIAGNOSIS — J3089 Other allergic rhinitis: Secondary | ICD-10-CM | POA: Diagnosis not present

## 2021-05-25 DIAGNOSIS — J301 Allergic rhinitis due to pollen: Secondary | ICD-10-CM | POA: Diagnosis not present

## 2021-05-25 DIAGNOSIS — J3089 Other allergic rhinitis: Secondary | ICD-10-CM | POA: Diagnosis not present

## 2021-05-25 DIAGNOSIS — K219 Gastro-esophageal reflux disease without esophagitis: Secondary | ICD-10-CM | POA: Diagnosis not present

## 2021-05-25 DIAGNOSIS — J3081 Allergic rhinitis due to animal (cat) (dog) hair and dander: Secondary | ICD-10-CM | POA: Diagnosis not present

## 2021-06-01 DIAGNOSIS — J301 Allergic rhinitis due to pollen: Secondary | ICD-10-CM | POA: Diagnosis not present

## 2021-06-01 DIAGNOSIS — J3081 Allergic rhinitis due to animal (cat) (dog) hair and dander: Secondary | ICD-10-CM | POA: Diagnosis not present

## 2021-06-01 DIAGNOSIS — J3089 Other allergic rhinitis: Secondary | ICD-10-CM | POA: Diagnosis not present

## 2021-06-15 DIAGNOSIS — J301 Allergic rhinitis due to pollen: Secondary | ICD-10-CM | POA: Diagnosis not present

## 2021-06-15 DIAGNOSIS — J3081 Allergic rhinitis due to animal (cat) (dog) hair and dander: Secondary | ICD-10-CM | POA: Diagnosis not present

## 2021-06-15 DIAGNOSIS — J3089 Other allergic rhinitis: Secondary | ICD-10-CM | POA: Diagnosis not present

## 2021-06-16 DIAGNOSIS — L821 Other seborrheic keratosis: Secondary | ICD-10-CM | POA: Diagnosis not present

## 2021-06-16 DIAGNOSIS — Z86018 Personal history of other benign neoplasm: Secondary | ICD-10-CM | POA: Diagnosis not present

## 2021-06-16 DIAGNOSIS — Z85828 Personal history of other malignant neoplasm of skin: Secondary | ICD-10-CM | POA: Diagnosis not present

## 2021-06-16 DIAGNOSIS — Z808 Family history of malignant neoplasm of other organs or systems: Secondary | ICD-10-CM | POA: Diagnosis not present

## 2021-06-16 DIAGNOSIS — L578 Other skin changes due to chronic exposure to nonionizing radiation: Secondary | ICD-10-CM | POA: Diagnosis not present

## 2021-06-16 DIAGNOSIS — D2261 Melanocytic nevi of right upper limb, including shoulder: Secondary | ICD-10-CM | POA: Diagnosis not present

## 2021-06-16 DIAGNOSIS — D485 Neoplasm of uncertain behavior of skin: Secondary | ICD-10-CM | POA: Diagnosis not present

## 2021-06-16 DIAGNOSIS — D225 Melanocytic nevi of trunk: Secondary | ICD-10-CM | POA: Diagnosis not present

## 2021-06-16 DIAGNOSIS — D2271 Melanocytic nevi of right lower limb, including hip: Secondary | ICD-10-CM | POA: Diagnosis not present

## 2021-06-16 DIAGNOSIS — B379 Candidiasis, unspecified: Secondary | ICD-10-CM | POA: Diagnosis not present

## 2021-06-20 DIAGNOSIS — J301 Allergic rhinitis due to pollen: Secondary | ICD-10-CM | POA: Diagnosis not present

## 2021-06-20 DIAGNOSIS — J3089 Other allergic rhinitis: Secondary | ICD-10-CM | POA: Diagnosis not present

## 2021-06-20 DIAGNOSIS — J3081 Allergic rhinitis due to animal (cat) (dog) hair and dander: Secondary | ICD-10-CM | POA: Diagnosis not present

## 2021-06-22 DIAGNOSIS — H10413 Chronic giant papillary conjunctivitis, bilateral: Secondary | ICD-10-CM | POA: Diagnosis not present

## 2021-06-30 DIAGNOSIS — J3089 Other allergic rhinitis: Secondary | ICD-10-CM | POA: Diagnosis not present

## 2021-06-30 DIAGNOSIS — J301 Allergic rhinitis due to pollen: Secondary | ICD-10-CM | POA: Diagnosis not present

## 2021-06-30 DIAGNOSIS — J3081 Allergic rhinitis due to animal (cat) (dog) hair and dander: Secondary | ICD-10-CM | POA: Diagnosis not present

## 2021-07-05 DIAGNOSIS — J301 Allergic rhinitis due to pollen: Secondary | ICD-10-CM | POA: Diagnosis not present

## 2021-07-05 DIAGNOSIS — J3089 Other allergic rhinitis: Secondary | ICD-10-CM | POA: Diagnosis not present

## 2021-07-05 DIAGNOSIS — J3081 Allergic rhinitis due to animal (cat) (dog) hair and dander: Secondary | ICD-10-CM | POA: Diagnosis not present

## 2021-07-13 DIAGNOSIS — J3081 Allergic rhinitis due to animal (cat) (dog) hair and dander: Secondary | ICD-10-CM | POA: Diagnosis not present

## 2021-07-13 DIAGNOSIS — J301 Allergic rhinitis due to pollen: Secondary | ICD-10-CM | POA: Diagnosis not present

## 2021-07-13 DIAGNOSIS — J3089 Other allergic rhinitis: Secondary | ICD-10-CM | POA: Diagnosis not present

## 2021-07-21 DIAGNOSIS — J3081 Allergic rhinitis due to animal (cat) (dog) hair and dander: Secondary | ICD-10-CM | POA: Diagnosis not present

## 2021-07-21 DIAGNOSIS — J3089 Other allergic rhinitis: Secondary | ICD-10-CM | POA: Diagnosis not present

## 2021-07-21 DIAGNOSIS — J301 Allergic rhinitis due to pollen: Secondary | ICD-10-CM | POA: Diagnosis not present

## 2021-07-28 DIAGNOSIS — J3081 Allergic rhinitis due to animal (cat) (dog) hair and dander: Secondary | ICD-10-CM | POA: Diagnosis not present

## 2021-07-28 DIAGNOSIS — J3089 Other allergic rhinitis: Secondary | ICD-10-CM | POA: Diagnosis not present

## 2021-07-28 DIAGNOSIS — J301 Allergic rhinitis due to pollen: Secondary | ICD-10-CM | POA: Diagnosis not present

## 2021-08-01 DIAGNOSIS — J3089 Other allergic rhinitis: Secondary | ICD-10-CM | POA: Diagnosis not present

## 2021-08-01 DIAGNOSIS — J301 Allergic rhinitis due to pollen: Secondary | ICD-10-CM | POA: Diagnosis not present

## 2021-08-01 DIAGNOSIS — J3081 Allergic rhinitis due to animal (cat) (dog) hair and dander: Secondary | ICD-10-CM | POA: Diagnosis not present

## 2021-08-03 DIAGNOSIS — J3081 Allergic rhinitis due to animal (cat) (dog) hair and dander: Secondary | ICD-10-CM | POA: Diagnosis not present

## 2021-08-03 DIAGNOSIS — J301 Allergic rhinitis due to pollen: Secondary | ICD-10-CM | POA: Diagnosis not present

## 2021-08-03 DIAGNOSIS — J3089 Other allergic rhinitis: Secondary | ICD-10-CM | POA: Diagnosis not present

## 2021-08-07 IMAGING — MG DIGITAL SCREENING BILAT W/ TOMO W/ CAD
8 series · 8 of 24 positions shown · non-contrast
Comparison: Previous exam(s).

CLINICAL DATA: Screening.

EXAM:
DIGITAL SCREENING BILATERAL MAMMOGRAM WITH TOMO AND CAD

[R MLO synth-2D]
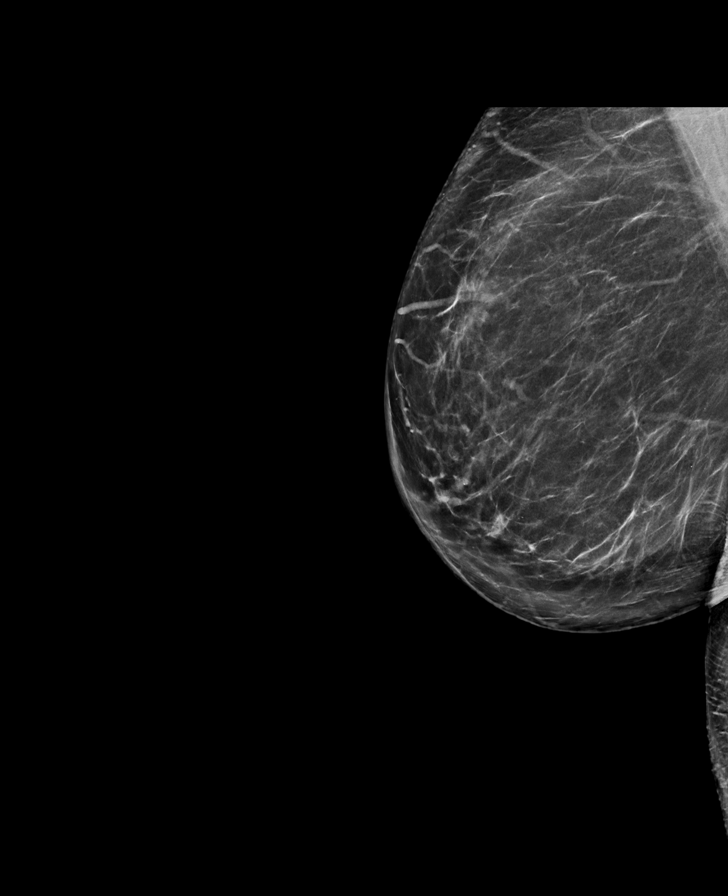

[R CC synth-2D]
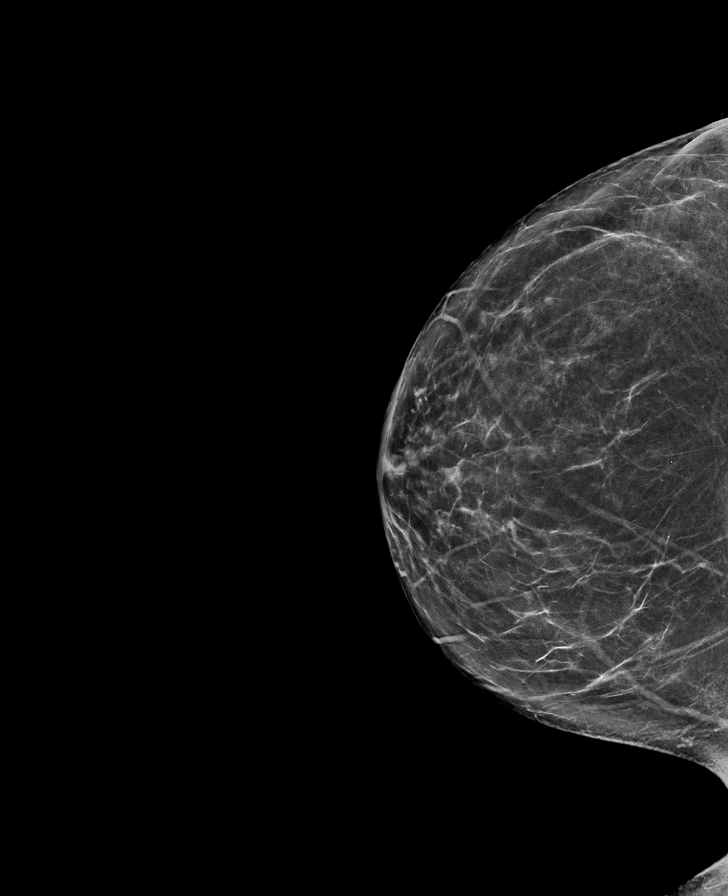

[L MLO synth-2D]
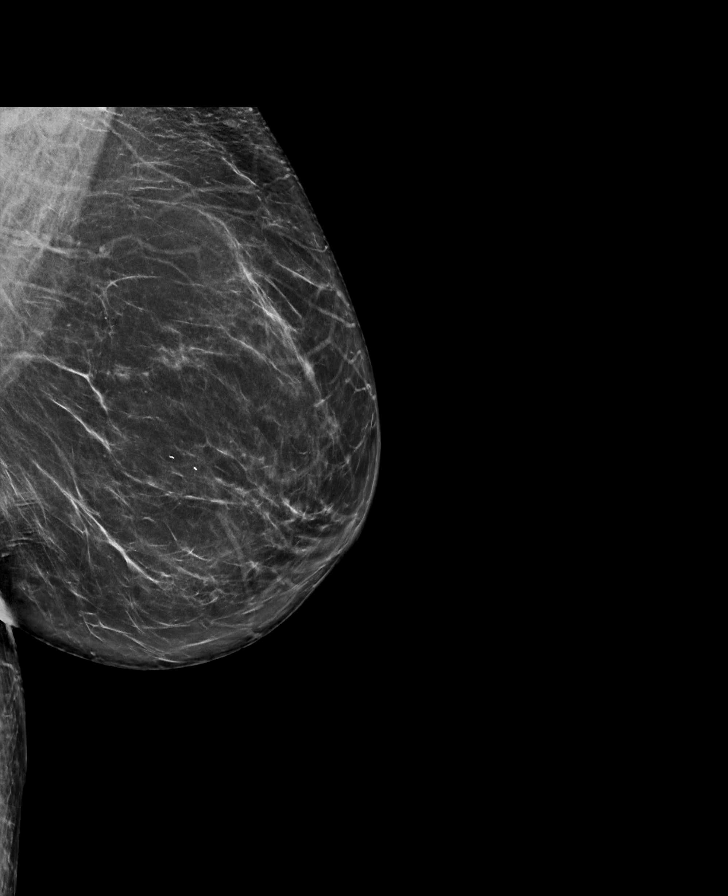

[L CC synth-2D]
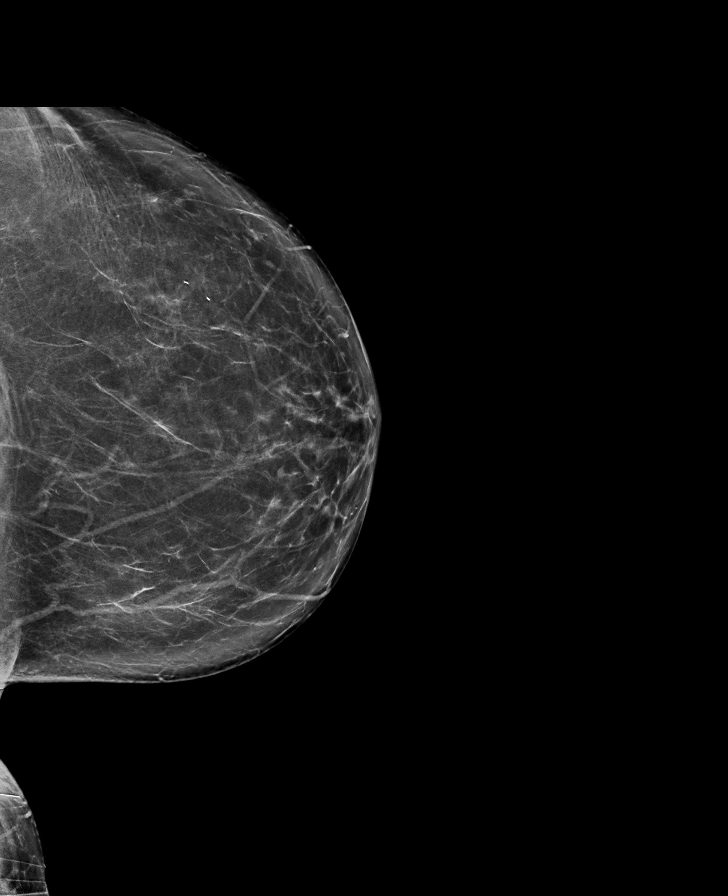

[R MLO tomo · tomo slice 41/82.0]
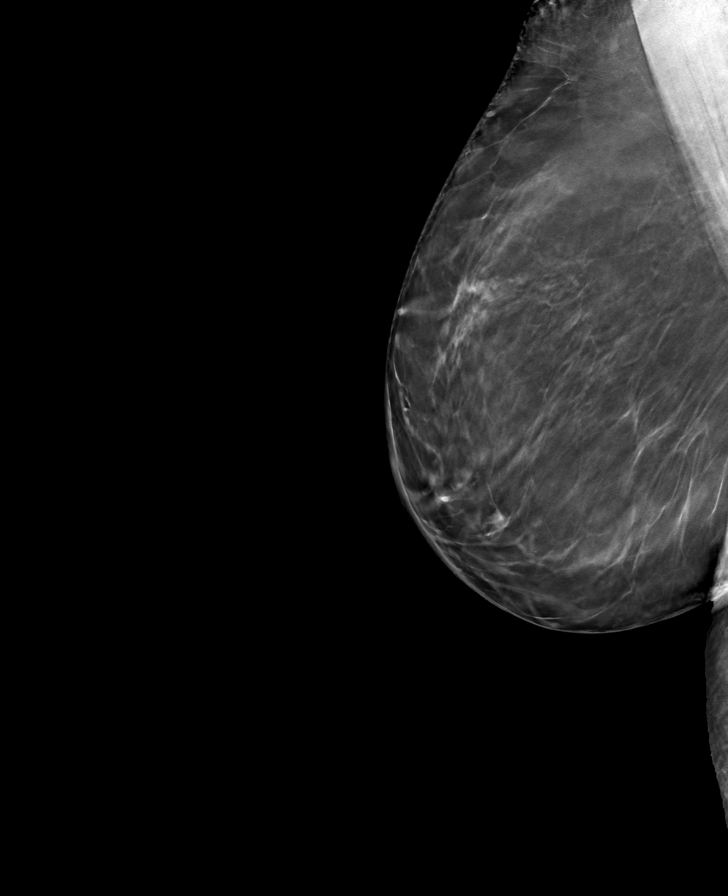

[L CC tomo · tomo slice 39/77.0]
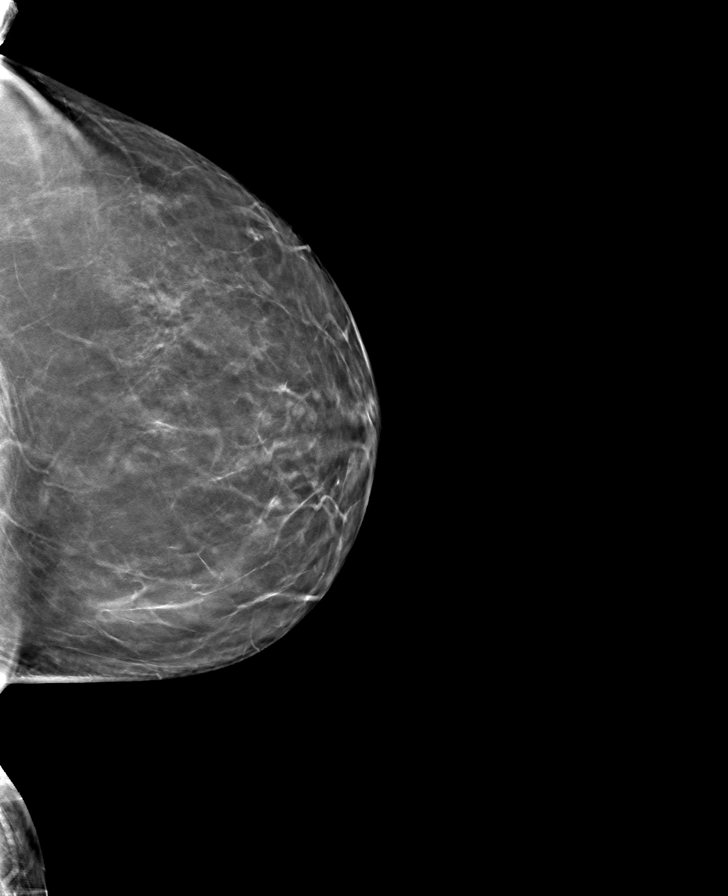

[R CC tomo · tomo slice 37/74.0]
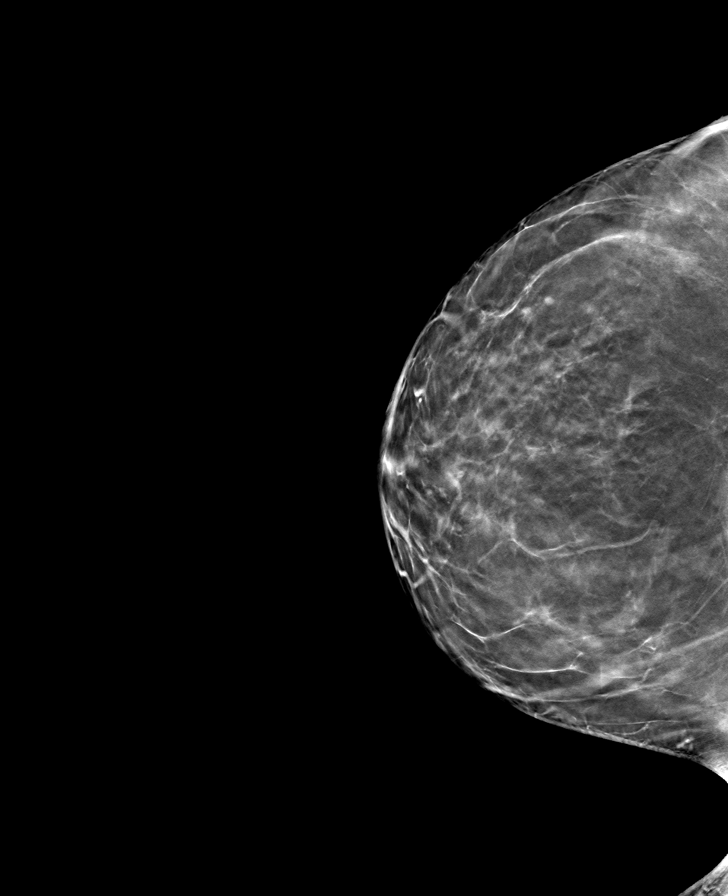

[L MLO tomo · tomo slice 41/82.0]
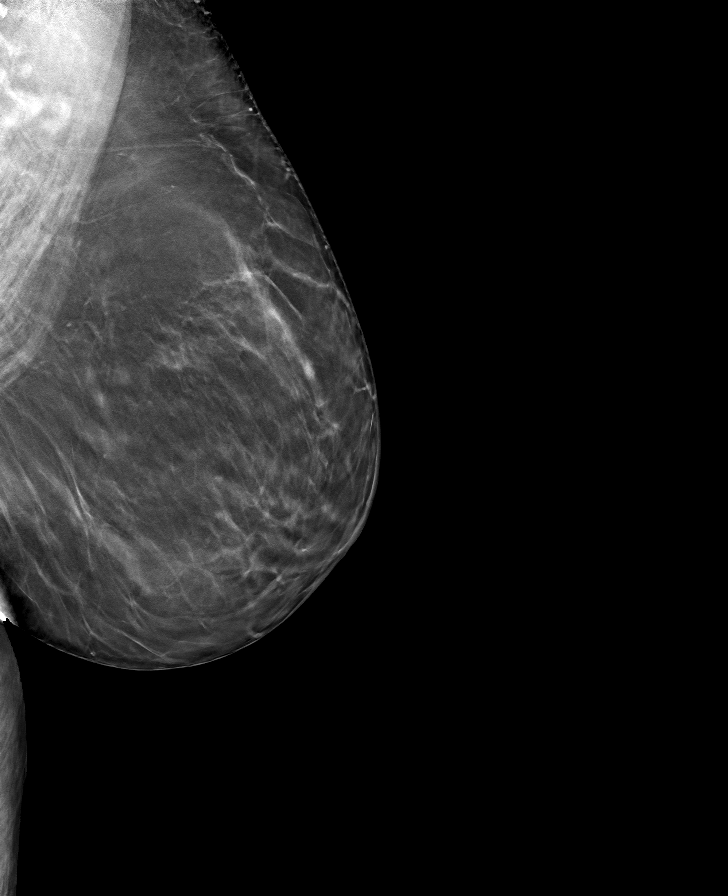

[8 of 24 positions shown; findings below may reference images not displayed]

ACR Breast Density Category b: There are scattered areas of
fibroglandular density.
FINDINGS: There are no findings suspicious for malignancy. Images were
processed with CAD.
IMPRESSION: No mammographic evidence of malignancy. A result letter of this
screening mammogram will be mailed directly to the patient.

RECOMMENDATION:
Screening mammogram in one year. (Code:[id number])

BI-RADS CATEGORY  1: Negative.

## 2021-08-10 DIAGNOSIS — J3081 Allergic rhinitis due to animal (cat) (dog) hair and dander: Secondary | ICD-10-CM | POA: Diagnosis not present

## 2021-08-10 DIAGNOSIS — J301 Allergic rhinitis due to pollen: Secondary | ICD-10-CM | POA: Diagnosis not present

## 2021-08-10 DIAGNOSIS — J3089 Other allergic rhinitis: Secondary | ICD-10-CM | POA: Diagnosis not present

## 2021-08-16 DIAGNOSIS — J3089 Other allergic rhinitis: Secondary | ICD-10-CM | POA: Diagnosis not present

## 2021-08-16 DIAGNOSIS — J3081 Allergic rhinitis due to animal (cat) (dog) hair and dander: Secondary | ICD-10-CM | POA: Diagnosis not present

## 2021-08-16 DIAGNOSIS — J301 Allergic rhinitis due to pollen: Secondary | ICD-10-CM | POA: Diagnosis not present

## 2021-08-25 DIAGNOSIS — J3081 Allergic rhinitis due to animal (cat) (dog) hair and dander: Secondary | ICD-10-CM | POA: Diagnosis not present

## 2021-08-25 DIAGNOSIS — J3089 Other allergic rhinitis: Secondary | ICD-10-CM | POA: Diagnosis not present

## 2021-08-25 DIAGNOSIS — J301 Allergic rhinitis due to pollen: Secondary | ICD-10-CM | POA: Diagnosis not present

## 2021-08-31 DIAGNOSIS — J301 Allergic rhinitis due to pollen: Secondary | ICD-10-CM | POA: Diagnosis not present

## 2021-08-31 DIAGNOSIS — J3081 Allergic rhinitis due to animal (cat) (dog) hair and dander: Secondary | ICD-10-CM | POA: Diagnosis not present

## 2021-08-31 DIAGNOSIS — J3089 Other allergic rhinitis: Secondary | ICD-10-CM | POA: Diagnosis not present

## 2021-09-04 DIAGNOSIS — J301 Allergic rhinitis due to pollen: Secondary | ICD-10-CM | POA: Diagnosis not present

## 2021-09-04 DIAGNOSIS — J3081 Allergic rhinitis due to animal (cat) (dog) hair and dander: Secondary | ICD-10-CM | POA: Diagnosis not present

## 2021-09-04 DIAGNOSIS — J3089 Other allergic rhinitis: Secondary | ICD-10-CM | POA: Diagnosis not present

## 2021-09-21 DIAGNOSIS — F419 Anxiety disorder, unspecified: Secondary | ICD-10-CM | POA: Diagnosis not present

## 2021-09-21 DIAGNOSIS — F4010 Social phobia, unspecified: Secondary | ICD-10-CM | POA: Diagnosis not present

## 2021-09-21 DIAGNOSIS — F3342 Major depressive disorder, recurrent, in full remission: Secondary | ICD-10-CM | POA: Diagnosis not present

## 2021-09-22 DIAGNOSIS — Z23 Encounter for immunization: Secondary | ICD-10-CM | POA: Diagnosis not present

## 2021-09-25 DIAGNOSIS — Z23 Encounter for immunization: Secondary | ICD-10-CM | POA: Diagnosis not present

## 2021-09-28 DIAGNOSIS — J301 Allergic rhinitis due to pollen: Secondary | ICD-10-CM | POA: Diagnosis not present

## 2021-09-28 DIAGNOSIS — J3089 Other allergic rhinitis: Secondary | ICD-10-CM | POA: Diagnosis not present

## 2021-10-02 DIAGNOSIS — J3081 Allergic rhinitis due to animal (cat) (dog) hair and dander: Secondary | ICD-10-CM | POA: Diagnosis not present

## 2021-10-02 DIAGNOSIS — J3089 Other allergic rhinitis: Secondary | ICD-10-CM | POA: Diagnosis not present

## 2021-10-02 DIAGNOSIS — J301 Allergic rhinitis due to pollen: Secondary | ICD-10-CM | POA: Diagnosis not present

## 2021-10-10 DIAGNOSIS — J301 Allergic rhinitis due to pollen: Secondary | ICD-10-CM | POA: Diagnosis not present

## 2021-10-10 DIAGNOSIS — J3081 Allergic rhinitis due to animal (cat) (dog) hair and dander: Secondary | ICD-10-CM | POA: Diagnosis not present

## 2021-10-10 DIAGNOSIS — J3089 Other allergic rhinitis: Secondary | ICD-10-CM | POA: Diagnosis not present

## 2021-10-17 DIAGNOSIS — J3081 Allergic rhinitis due to animal (cat) (dog) hair and dander: Secondary | ICD-10-CM | POA: Diagnosis not present

## 2021-10-17 DIAGNOSIS — J301 Allergic rhinitis due to pollen: Secondary | ICD-10-CM | POA: Diagnosis not present

## 2021-10-17 DIAGNOSIS — J3089 Other allergic rhinitis: Secondary | ICD-10-CM | POA: Diagnosis not present

## 2021-11-02 DIAGNOSIS — J3081 Allergic rhinitis due to animal (cat) (dog) hair and dander: Secondary | ICD-10-CM | POA: Diagnosis not present

## 2021-11-02 DIAGNOSIS — J3089 Other allergic rhinitis: Secondary | ICD-10-CM | POA: Diagnosis not present

## 2021-11-02 DIAGNOSIS — J301 Allergic rhinitis due to pollen: Secondary | ICD-10-CM | POA: Diagnosis not present

## 2021-11-08 DIAGNOSIS — J301 Allergic rhinitis due to pollen: Secondary | ICD-10-CM | POA: Diagnosis not present

## 2021-11-08 DIAGNOSIS — J3089 Other allergic rhinitis: Secondary | ICD-10-CM | POA: Diagnosis not present

## 2021-11-08 DIAGNOSIS — J3081 Allergic rhinitis due to animal (cat) (dog) hair and dander: Secondary | ICD-10-CM | POA: Diagnosis not present

## 2021-11-17 DIAGNOSIS — J301 Allergic rhinitis due to pollen: Secondary | ICD-10-CM | POA: Diagnosis not present

## 2021-11-17 DIAGNOSIS — J3081 Allergic rhinitis due to animal (cat) (dog) hair and dander: Secondary | ICD-10-CM | POA: Diagnosis not present

## 2021-11-17 DIAGNOSIS — J3089 Other allergic rhinitis: Secondary | ICD-10-CM | POA: Diagnosis not present

## 2021-11-22 DIAGNOSIS — J3089 Other allergic rhinitis: Secondary | ICD-10-CM | POA: Diagnosis not present

## 2021-11-22 DIAGNOSIS — J301 Allergic rhinitis due to pollen: Secondary | ICD-10-CM | POA: Diagnosis not present

## 2021-11-22 DIAGNOSIS — J3081 Allergic rhinitis due to animal (cat) (dog) hair and dander: Secondary | ICD-10-CM | POA: Diagnosis not present

## 2021-11-28 DIAGNOSIS — J301 Allergic rhinitis due to pollen: Secondary | ICD-10-CM | POA: Diagnosis not present

## 2021-11-28 DIAGNOSIS — J3089 Other allergic rhinitis: Secondary | ICD-10-CM | POA: Diagnosis not present

## 2021-11-28 DIAGNOSIS — J3081 Allergic rhinitis due to animal (cat) (dog) hair and dander: Secondary | ICD-10-CM | POA: Diagnosis not present

## 2021-12-12 DIAGNOSIS — J3081 Allergic rhinitis due to animal (cat) (dog) hair and dander: Secondary | ICD-10-CM | POA: Diagnosis not present

## 2021-12-12 DIAGNOSIS — J301 Allergic rhinitis due to pollen: Secondary | ICD-10-CM | POA: Diagnosis not present

## 2021-12-12 DIAGNOSIS — J3089 Other allergic rhinitis: Secondary | ICD-10-CM | POA: Diagnosis not present

## 2021-12-19 DIAGNOSIS — L820 Inflamed seborrheic keratosis: Secondary | ICD-10-CM | POA: Diagnosis not present

## 2021-12-19 DIAGNOSIS — Z808 Family history of malignant neoplasm of other organs or systems: Secondary | ICD-10-CM | POA: Diagnosis not present

## 2021-12-19 DIAGNOSIS — Z85828 Personal history of other malignant neoplasm of skin: Secondary | ICD-10-CM | POA: Diagnosis not present

## 2021-12-19 DIAGNOSIS — D225 Melanocytic nevi of trunk: Secondary | ICD-10-CM | POA: Diagnosis not present

## 2021-12-19 DIAGNOSIS — L821 Other seborrheic keratosis: Secondary | ICD-10-CM | POA: Diagnosis not present

## 2021-12-19 DIAGNOSIS — L578 Other skin changes due to chronic exposure to nonionizing radiation: Secondary | ICD-10-CM | POA: Diagnosis not present

## 2021-12-19 DIAGNOSIS — L570 Actinic keratosis: Secondary | ICD-10-CM | POA: Diagnosis not present

## 2021-12-19 DIAGNOSIS — D2261 Melanocytic nevi of right upper limb, including shoulder: Secondary | ICD-10-CM | POA: Diagnosis not present

## 2021-12-19 DIAGNOSIS — D2271 Melanocytic nevi of right lower limb, including hip: Secondary | ICD-10-CM | POA: Diagnosis not present

## 2021-12-19 DIAGNOSIS — Z86018 Personal history of other benign neoplasm: Secondary | ICD-10-CM | POA: Diagnosis not present

## 2021-12-26 DIAGNOSIS — J3089 Other allergic rhinitis: Secondary | ICD-10-CM | POA: Diagnosis not present

## 2021-12-26 DIAGNOSIS — J301 Allergic rhinitis due to pollen: Secondary | ICD-10-CM | POA: Diagnosis not present

## 2021-12-26 DIAGNOSIS — J3081 Allergic rhinitis due to animal (cat) (dog) hair and dander: Secondary | ICD-10-CM | POA: Diagnosis not present

## 2022-01-01 DIAGNOSIS — J3089 Other allergic rhinitis: Secondary | ICD-10-CM | POA: Diagnosis not present

## 2022-01-01 DIAGNOSIS — J3081 Allergic rhinitis due to animal (cat) (dog) hair and dander: Secondary | ICD-10-CM | POA: Diagnosis not present

## 2022-01-01 DIAGNOSIS — J301 Allergic rhinitis due to pollen: Secondary | ICD-10-CM | POA: Diagnosis not present

## 2022-01-03 DIAGNOSIS — J3089 Other allergic rhinitis: Secondary | ICD-10-CM | POA: Diagnosis not present

## 2022-01-03 DIAGNOSIS — J3081 Allergic rhinitis due to animal (cat) (dog) hair and dander: Secondary | ICD-10-CM | POA: Diagnosis not present

## 2022-01-03 DIAGNOSIS — J301 Allergic rhinitis due to pollen: Secondary | ICD-10-CM | POA: Diagnosis not present

## 2022-01-05 ENCOUNTER — Other Ambulatory Visit: Payer: Self-pay | Admitting: Internal Medicine

## 2022-01-05 DIAGNOSIS — Z1231 Encounter for screening mammogram for malignant neoplasm of breast: Secondary | ICD-10-CM

## 2022-01-10 DIAGNOSIS — E785 Hyperlipidemia, unspecified: Secondary | ICD-10-CM | POA: Diagnosis not present

## 2022-01-10 DIAGNOSIS — E559 Vitamin D deficiency, unspecified: Secondary | ICD-10-CM | POA: Diagnosis not present

## 2022-01-10 DIAGNOSIS — R7301 Impaired fasting glucose: Secondary | ICD-10-CM | POA: Diagnosis not present

## 2022-01-11 DIAGNOSIS — J3081 Allergic rhinitis due to animal (cat) (dog) hair and dander: Secondary | ICD-10-CM | POA: Diagnosis not present

## 2022-01-11 DIAGNOSIS — J3089 Other allergic rhinitis: Secondary | ICD-10-CM | POA: Diagnosis not present

## 2022-01-11 DIAGNOSIS — J301 Allergic rhinitis due to pollen: Secondary | ICD-10-CM | POA: Diagnosis not present

## 2022-01-16 DIAGNOSIS — J3081 Allergic rhinitis due to animal (cat) (dog) hair and dander: Secondary | ICD-10-CM | POA: Diagnosis not present

## 2022-01-16 DIAGNOSIS — J3089 Other allergic rhinitis: Secondary | ICD-10-CM | POA: Diagnosis not present

## 2022-01-16 DIAGNOSIS — J301 Allergic rhinitis due to pollen: Secondary | ICD-10-CM | POA: Diagnosis not present

## 2022-01-17 DIAGNOSIS — Z23 Encounter for immunization: Secondary | ICD-10-CM | POA: Diagnosis not present

## 2022-01-17 DIAGNOSIS — M8580 Other specified disorders of bone density and structure, unspecified site: Secondary | ICD-10-CM | POA: Diagnosis not present

## 2022-01-17 DIAGNOSIS — Z1339 Encounter for screening examination for other mental health and behavioral disorders: Secondary | ICD-10-CM | POA: Diagnosis not present

## 2022-01-17 DIAGNOSIS — M545 Low back pain, unspecified: Secondary | ICD-10-CM | POA: Diagnosis not present

## 2022-01-17 DIAGNOSIS — Z1331 Encounter for screening for depression: Secondary | ICD-10-CM | POA: Diagnosis not present

## 2022-01-17 DIAGNOSIS — Z85828 Personal history of other malignant neoplasm of skin: Secondary | ICD-10-CM | POA: Diagnosis not present

## 2022-01-17 DIAGNOSIS — Z0000 Encounter for general adult medical examination without abnormal findings: Secondary | ICD-10-CM | POA: Diagnosis not present

## 2022-01-17 DIAGNOSIS — K219 Gastro-esophageal reflux disease without esophagitis: Secondary | ICD-10-CM | POA: Diagnosis not present

## 2022-01-17 DIAGNOSIS — R7301 Impaired fasting glucose: Secondary | ICD-10-CM | POA: Diagnosis not present

## 2022-01-17 DIAGNOSIS — R82998 Other abnormal findings in urine: Secondary | ICD-10-CM | POA: Diagnosis not present

## 2022-01-17 DIAGNOSIS — I471 Supraventricular tachycardia: Secondary | ICD-10-CM | POA: Diagnosis not present

## 2022-01-17 DIAGNOSIS — Z1212 Encounter for screening for malignant neoplasm of rectum: Secondary | ICD-10-CM | POA: Diagnosis not present

## 2022-01-23 DIAGNOSIS — J3089 Other allergic rhinitis: Secondary | ICD-10-CM | POA: Diagnosis not present

## 2022-01-23 DIAGNOSIS — J3081 Allergic rhinitis due to animal (cat) (dog) hair and dander: Secondary | ICD-10-CM | POA: Diagnosis not present

## 2022-01-23 DIAGNOSIS — J301 Allergic rhinitis due to pollen: Secondary | ICD-10-CM | POA: Diagnosis not present

## 2022-01-25 ENCOUNTER — Ambulatory Visit
Admission: RE | Admit: 2022-01-25 | Discharge: 2022-01-25 | Disposition: A | Discharge status: Home / Self Care | Payer: Medicare Other | Source: Ambulatory Visit | Attending: Internal Medicine | Admitting: Internal Medicine

## 2022-01-25 DIAGNOSIS — Z1231 Encounter for screening mammogram for malignant neoplasm of breast: Secondary | ICD-10-CM | POA: Diagnosis not present

## 2022-01-30 DIAGNOSIS — J3089 Other allergic rhinitis: Secondary | ICD-10-CM | POA: Diagnosis not present

## 2022-01-30 DIAGNOSIS — J3081 Allergic rhinitis due to animal (cat) (dog) hair and dander: Secondary | ICD-10-CM | POA: Diagnosis not present

## 2022-01-30 DIAGNOSIS — J301 Allergic rhinitis due to pollen: Secondary | ICD-10-CM | POA: Diagnosis not present

## 2022-01-31 DIAGNOSIS — M545 Low back pain, unspecified: Secondary | ICD-10-CM | POA: Diagnosis not present

## 2022-02-02 DIAGNOSIS — M545 Low back pain, unspecified: Secondary | ICD-10-CM | POA: Diagnosis not present

## 2022-02-06 DIAGNOSIS — M545 Low back pain, unspecified: Secondary | ICD-10-CM | POA: Diagnosis not present

## 2022-02-06 DIAGNOSIS — J3089 Other allergic rhinitis: Secondary | ICD-10-CM | POA: Diagnosis not present

## 2022-02-06 DIAGNOSIS — J3081 Allergic rhinitis due to animal (cat) (dog) hair and dander: Secondary | ICD-10-CM | POA: Diagnosis not present

## 2022-02-06 DIAGNOSIS — J301 Allergic rhinitis due to pollen: Secondary | ICD-10-CM | POA: Diagnosis not present

## 2022-02-09 DIAGNOSIS — M545 Low back pain, unspecified: Secondary | ICD-10-CM | POA: Diagnosis not present

## 2022-02-09 DIAGNOSIS — J301 Allergic rhinitis due to pollen: Secondary | ICD-10-CM | POA: Diagnosis not present

## 2022-02-09 DIAGNOSIS — J3089 Other allergic rhinitis: Secondary | ICD-10-CM | POA: Diagnosis not present

## 2022-02-09 DIAGNOSIS — J3081 Allergic rhinitis due to animal (cat) (dog) hair and dander: Secondary | ICD-10-CM | POA: Diagnosis not present

## 2022-02-13 DIAGNOSIS — J3081 Allergic rhinitis due to animal (cat) (dog) hair and dander: Secondary | ICD-10-CM | POA: Diagnosis not present

## 2022-02-13 DIAGNOSIS — M545 Low back pain, unspecified: Secondary | ICD-10-CM | POA: Diagnosis not present

## 2022-02-13 DIAGNOSIS — J301 Allergic rhinitis due to pollen: Secondary | ICD-10-CM | POA: Diagnosis not present

## 2022-02-13 DIAGNOSIS — J3089 Other allergic rhinitis: Secondary | ICD-10-CM | POA: Diagnosis not present

## 2022-02-15 DIAGNOSIS — M545 Low back pain, unspecified: Secondary | ICD-10-CM | POA: Diagnosis not present

## 2022-02-20 DIAGNOSIS — J3089 Other allergic rhinitis: Secondary | ICD-10-CM | POA: Diagnosis not present

## 2022-02-20 DIAGNOSIS — J3081 Allergic rhinitis due to animal (cat) (dog) hair and dander: Secondary | ICD-10-CM | POA: Diagnosis not present

## 2022-02-20 DIAGNOSIS — M545 Low back pain, unspecified: Secondary | ICD-10-CM | POA: Diagnosis not present

## 2022-02-20 DIAGNOSIS — J301 Allergic rhinitis due to pollen: Secondary | ICD-10-CM | POA: Diagnosis not present

## 2022-02-22 DIAGNOSIS — M8589 Other specified disorders of bone density and structure, multiple sites: Secondary | ICD-10-CM | POA: Diagnosis not present

## 2022-02-22 DIAGNOSIS — M545 Low back pain, unspecified: Secondary | ICD-10-CM | POA: Diagnosis not present

## 2022-02-27 DIAGNOSIS — J301 Allergic rhinitis due to pollen: Secondary | ICD-10-CM | POA: Diagnosis not present

## 2022-02-27 DIAGNOSIS — J3081 Allergic rhinitis due to animal (cat) (dog) hair and dander: Secondary | ICD-10-CM | POA: Diagnosis not present

## 2022-02-27 DIAGNOSIS — M545 Low back pain, unspecified: Secondary | ICD-10-CM | POA: Diagnosis not present

## 2022-02-27 DIAGNOSIS — J3089 Other allergic rhinitis: Secondary | ICD-10-CM | POA: Diagnosis not present

## 2022-03-01 DIAGNOSIS — M545 Low back pain, unspecified: Secondary | ICD-10-CM | POA: Diagnosis not present

## 2022-03-15 DIAGNOSIS — J3081 Allergic rhinitis due to animal (cat) (dog) hair and dander: Secondary | ICD-10-CM | POA: Diagnosis not present

## 2022-03-15 DIAGNOSIS — J3089 Other allergic rhinitis: Secondary | ICD-10-CM | POA: Diagnosis not present

## 2022-03-15 DIAGNOSIS — J301 Allergic rhinitis due to pollen: Secondary | ICD-10-CM | POA: Diagnosis not present

## 2022-03-15 DIAGNOSIS — M545 Low back pain, unspecified: Secondary | ICD-10-CM | POA: Diagnosis not present

## 2022-03-20 DIAGNOSIS — M545 Low back pain, unspecified: Secondary | ICD-10-CM | POA: Diagnosis not present

## 2022-03-22 DIAGNOSIS — M545 Low back pain, unspecified: Secondary | ICD-10-CM | POA: Diagnosis not present

## 2022-03-27 DIAGNOSIS — J301 Allergic rhinitis due to pollen: Secondary | ICD-10-CM | POA: Diagnosis not present

## 2022-03-27 DIAGNOSIS — J3089 Other allergic rhinitis: Secondary | ICD-10-CM | POA: Diagnosis not present

## 2022-03-27 DIAGNOSIS — J3081 Allergic rhinitis due to animal (cat) (dog) hair and dander: Secondary | ICD-10-CM | POA: Diagnosis not present

## 2022-03-27 DIAGNOSIS — M545 Low back pain, unspecified: Secondary | ICD-10-CM | POA: Diagnosis not present

## 2022-03-29 DIAGNOSIS — M545 Low back pain, unspecified: Secondary | ICD-10-CM | POA: Diagnosis not present

## 2022-04-02 DIAGNOSIS — M545 Low back pain, unspecified: Secondary | ICD-10-CM | POA: Diagnosis not present

## 2022-04-03 DIAGNOSIS — J3081 Allergic rhinitis due to animal (cat) (dog) hair and dander: Secondary | ICD-10-CM | POA: Diagnosis not present

## 2022-04-03 DIAGNOSIS — J3089 Other allergic rhinitis: Secondary | ICD-10-CM | POA: Diagnosis not present

## 2022-04-03 DIAGNOSIS — J301 Allergic rhinitis due to pollen: Secondary | ICD-10-CM | POA: Diagnosis not present

## 2022-04-10 DIAGNOSIS — J3081 Allergic rhinitis due to animal (cat) (dog) hair and dander: Secondary | ICD-10-CM | POA: Diagnosis not present

## 2022-04-10 DIAGNOSIS — J3089 Other allergic rhinitis: Secondary | ICD-10-CM | POA: Diagnosis not present

## 2022-04-10 DIAGNOSIS — J301 Allergic rhinitis due to pollen: Secondary | ICD-10-CM | POA: Diagnosis not present

## 2022-04-10 DIAGNOSIS — M545 Low back pain, unspecified: Secondary | ICD-10-CM | POA: Diagnosis not present

## 2022-04-13 DIAGNOSIS — M545 Low back pain, unspecified: Secondary | ICD-10-CM | POA: Diagnosis not present

## 2022-04-16 DIAGNOSIS — H26493 Other secondary cataract, bilateral: Secondary | ICD-10-CM | POA: Diagnosis not present

## 2022-04-16 DIAGNOSIS — H40013 Open angle with borderline findings, low risk, bilateral: Secondary | ICD-10-CM | POA: Diagnosis not present

## 2022-04-16 DIAGNOSIS — H43813 Vitreous degeneration, bilateral: Secondary | ICD-10-CM | POA: Diagnosis not present

## 2022-04-16 DIAGNOSIS — H524 Presbyopia: Secondary | ICD-10-CM | POA: Diagnosis not present

## 2022-04-17 DIAGNOSIS — J3081 Allergic rhinitis due to animal (cat) (dog) hair and dander: Secondary | ICD-10-CM | POA: Diagnosis not present

## 2022-04-17 DIAGNOSIS — J3089 Other allergic rhinitis: Secondary | ICD-10-CM | POA: Diagnosis not present

## 2022-04-17 DIAGNOSIS — J301 Allergic rhinitis due to pollen: Secondary | ICD-10-CM | POA: Diagnosis not present

## 2022-04-18 DIAGNOSIS — M545 Low back pain, unspecified: Secondary | ICD-10-CM | POA: Diagnosis not present

## 2022-04-24 DIAGNOSIS — M545 Low back pain, unspecified: Secondary | ICD-10-CM | POA: Diagnosis not present

## 2022-04-25 DIAGNOSIS — J3089 Other allergic rhinitis: Secondary | ICD-10-CM | POA: Diagnosis not present

## 2022-04-25 DIAGNOSIS — J301 Allergic rhinitis due to pollen: Secondary | ICD-10-CM | POA: Diagnosis not present

## 2022-04-25 DIAGNOSIS — J3081 Allergic rhinitis due to animal (cat) (dog) hair and dander: Secondary | ICD-10-CM | POA: Diagnosis not present

## 2022-04-26 DIAGNOSIS — M545 Low back pain, unspecified: Secondary | ICD-10-CM | POA: Diagnosis not present

## 2022-04-30 DIAGNOSIS — M545 Low back pain, unspecified: Secondary | ICD-10-CM | POA: Diagnosis not present

## 2022-05-03 DIAGNOSIS — M545 Low back pain, unspecified: Secondary | ICD-10-CM | POA: Diagnosis not present

## 2022-05-08 DIAGNOSIS — J3089 Other allergic rhinitis: Secondary | ICD-10-CM | POA: Diagnosis not present

## 2022-05-08 DIAGNOSIS — J301 Allergic rhinitis due to pollen: Secondary | ICD-10-CM | POA: Diagnosis not present

## 2022-05-08 DIAGNOSIS — J3081 Allergic rhinitis due to animal (cat) (dog) hair and dander: Secondary | ICD-10-CM | POA: Diagnosis not present

## 2022-05-08 DIAGNOSIS — M545 Low back pain, unspecified: Secondary | ICD-10-CM | POA: Diagnosis not present

## 2022-05-10 DIAGNOSIS — M545 Low back pain, unspecified: Secondary | ICD-10-CM | POA: Diagnosis not present

## 2022-05-16 DIAGNOSIS — J3089 Other allergic rhinitis: Secondary | ICD-10-CM | POA: Diagnosis not present

## 2022-05-16 DIAGNOSIS — J301 Allergic rhinitis due to pollen: Secondary | ICD-10-CM | POA: Diagnosis not present

## 2022-05-16 DIAGNOSIS — J3081 Allergic rhinitis due to animal (cat) (dog) hair and dander: Secondary | ICD-10-CM | POA: Diagnosis not present

## 2022-05-23 DIAGNOSIS — M545 Low back pain, unspecified: Secondary | ICD-10-CM | POA: Diagnosis not present

## 2022-05-24 DIAGNOSIS — J3089 Other allergic rhinitis: Secondary | ICD-10-CM | POA: Diagnosis not present

## 2022-05-24 DIAGNOSIS — K219 Gastro-esophageal reflux disease without esophagitis: Secondary | ICD-10-CM | POA: Diagnosis not present

## 2022-05-24 DIAGNOSIS — J301 Allergic rhinitis due to pollen: Secondary | ICD-10-CM | POA: Diagnosis not present

## 2022-05-24 DIAGNOSIS — J3081 Allergic rhinitis due to animal (cat) (dog) hair and dander: Secondary | ICD-10-CM | POA: Diagnosis not present

## 2022-05-28 DIAGNOSIS — M545 Low back pain, unspecified: Secondary | ICD-10-CM | POA: Diagnosis not present

## 2022-05-29 DIAGNOSIS — J301 Allergic rhinitis due to pollen: Secondary | ICD-10-CM | POA: Diagnosis not present

## 2022-05-29 DIAGNOSIS — J3089 Other allergic rhinitis: Secondary | ICD-10-CM | POA: Diagnosis not present

## 2022-05-29 DIAGNOSIS — J3081 Allergic rhinitis due to animal (cat) (dog) hair and dander: Secondary | ICD-10-CM | POA: Diagnosis not present

## 2022-05-30 DIAGNOSIS — M545 Low back pain, unspecified: Secondary | ICD-10-CM | POA: Diagnosis not present

## 2022-06-04 DIAGNOSIS — J3089 Other allergic rhinitis: Secondary | ICD-10-CM | POA: Diagnosis not present

## 2022-06-04 DIAGNOSIS — M545 Low back pain, unspecified: Secondary | ICD-10-CM | POA: Diagnosis not present

## 2022-06-04 DIAGNOSIS — J301 Allergic rhinitis due to pollen: Secondary | ICD-10-CM | POA: Diagnosis not present

## 2022-06-04 DIAGNOSIS — J3081 Allergic rhinitis due to animal (cat) (dog) hair and dander: Secondary | ICD-10-CM | POA: Diagnosis not present

## 2022-06-07 DIAGNOSIS — M545 Low back pain, unspecified: Secondary | ICD-10-CM | POA: Diagnosis not present

## 2022-06-11 DIAGNOSIS — J3081 Allergic rhinitis due to animal (cat) (dog) hair and dander: Secondary | ICD-10-CM | POA: Diagnosis not present

## 2022-06-11 DIAGNOSIS — J3089 Other allergic rhinitis: Secondary | ICD-10-CM | POA: Diagnosis not present

## 2022-06-11 DIAGNOSIS — M545 Low back pain, unspecified: Secondary | ICD-10-CM | POA: Diagnosis not present

## 2022-06-11 DIAGNOSIS — J301 Allergic rhinitis due to pollen: Secondary | ICD-10-CM | POA: Diagnosis not present

## 2022-06-21 DIAGNOSIS — M545 Low back pain, unspecified: Secondary | ICD-10-CM | POA: Diagnosis not present

## 2022-06-27 DIAGNOSIS — M545 Low back pain, unspecified: Secondary | ICD-10-CM | POA: Diagnosis not present

## 2022-06-28 DIAGNOSIS — E785 Hyperlipidemia, unspecified: Secondary | ICD-10-CM | POA: Diagnosis not present

## 2022-06-28 DIAGNOSIS — K589 Irritable bowel syndrome without diarrhea: Secondary | ICD-10-CM | POA: Diagnosis not present

## 2022-06-28 DIAGNOSIS — M25551 Pain in right hip: Secondary | ICD-10-CM | POA: Diagnosis not present

## 2022-06-28 DIAGNOSIS — K219 Gastro-esophageal reflux disease without esophagitis: Secondary | ICD-10-CM | POA: Diagnosis not present

## 2022-06-28 DIAGNOSIS — I471 Supraventricular tachycardia: Secondary | ICD-10-CM | POA: Diagnosis not present

## 2022-06-28 DIAGNOSIS — J309 Allergic rhinitis, unspecified: Secondary | ICD-10-CM | POA: Diagnosis not present

## 2022-07-03 DIAGNOSIS — M545 Low back pain, unspecified: Secondary | ICD-10-CM | POA: Diagnosis not present

## 2022-07-04 DIAGNOSIS — J3081 Allergic rhinitis due to animal (cat) (dog) hair and dander: Secondary | ICD-10-CM | POA: Diagnosis not present

## 2022-07-04 DIAGNOSIS — J3089 Other allergic rhinitis: Secondary | ICD-10-CM | POA: Diagnosis not present

## 2022-07-04 DIAGNOSIS — J301 Allergic rhinitis due to pollen: Secondary | ICD-10-CM | POA: Diagnosis not present

## 2022-07-10 DIAGNOSIS — M545 Low back pain, unspecified: Secondary | ICD-10-CM | POA: Diagnosis not present

## 2022-07-10 DIAGNOSIS — J3081 Allergic rhinitis due to animal (cat) (dog) hair and dander: Secondary | ICD-10-CM | POA: Diagnosis not present

## 2022-07-10 DIAGNOSIS — J301 Allergic rhinitis due to pollen: Secondary | ICD-10-CM | POA: Diagnosis not present

## 2022-07-10 DIAGNOSIS — J3089 Other allergic rhinitis: Secondary | ICD-10-CM | POA: Diagnosis not present

## 2022-07-17 DIAGNOSIS — M545 Low back pain, unspecified: Secondary | ICD-10-CM | POA: Diagnosis not present

## 2022-07-24 DIAGNOSIS — J3089 Other allergic rhinitis: Secondary | ICD-10-CM | POA: Diagnosis not present

## 2022-07-24 DIAGNOSIS — J3081 Allergic rhinitis due to animal (cat) (dog) hair and dander: Secondary | ICD-10-CM | POA: Diagnosis not present

## 2022-07-24 DIAGNOSIS — J301 Allergic rhinitis due to pollen: Secondary | ICD-10-CM | POA: Diagnosis not present

## 2022-07-31 DIAGNOSIS — J3081 Allergic rhinitis due to animal (cat) (dog) hair and dander: Secondary | ICD-10-CM | POA: Diagnosis not present

## 2022-07-31 DIAGNOSIS — J301 Allergic rhinitis due to pollen: Secondary | ICD-10-CM | POA: Diagnosis not present

## 2022-07-31 DIAGNOSIS — J3089 Other allergic rhinitis: Secondary | ICD-10-CM | POA: Diagnosis not present

## 2022-08-07 DIAGNOSIS — J3081 Allergic rhinitis due to animal (cat) (dog) hair and dander: Secondary | ICD-10-CM | POA: Diagnosis not present

## 2022-08-07 DIAGNOSIS — M545 Low back pain, unspecified: Secondary | ICD-10-CM | POA: Diagnosis not present

## 2022-08-07 DIAGNOSIS — J301 Allergic rhinitis due to pollen: Secondary | ICD-10-CM | POA: Diagnosis not present

## 2022-08-07 DIAGNOSIS — J3089 Other allergic rhinitis: Secondary | ICD-10-CM | POA: Diagnosis not present

## 2022-08-14 DIAGNOSIS — M545 Low back pain, unspecified: Secondary | ICD-10-CM | POA: Diagnosis not present

## 2022-08-15 DIAGNOSIS — J3089 Other allergic rhinitis: Secondary | ICD-10-CM | POA: Diagnosis not present

## 2022-08-15 DIAGNOSIS — J3081 Allergic rhinitis due to animal (cat) (dog) hair and dander: Secondary | ICD-10-CM | POA: Diagnosis not present

## 2022-08-15 DIAGNOSIS — J301 Allergic rhinitis due to pollen: Secondary | ICD-10-CM | POA: Diagnosis not present

## 2022-08-21 DIAGNOSIS — J3081 Allergic rhinitis due to animal (cat) (dog) hair and dander: Secondary | ICD-10-CM | POA: Diagnosis not present

## 2022-08-21 DIAGNOSIS — J301 Allergic rhinitis due to pollen: Secondary | ICD-10-CM | POA: Diagnosis not present

## 2022-08-21 DIAGNOSIS — J3089 Other allergic rhinitis: Secondary | ICD-10-CM | POA: Diagnosis not present

## 2022-08-22 DIAGNOSIS — Z23 Encounter for immunization: Secondary | ICD-10-CM | POA: Diagnosis not present

## 2022-08-30 DIAGNOSIS — J3081 Allergic rhinitis due to animal (cat) (dog) hair and dander: Secondary | ICD-10-CM | POA: Diagnosis not present

## 2022-08-30 DIAGNOSIS — J301 Allergic rhinitis due to pollen: Secondary | ICD-10-CM | POA: Diagnosis not present

## 2022-08-30 DIAGNOSIS — J3089 Other allergic rhinitis: Secondary | ICD-10-CM | POA: Diagnosis not present

## 2022-09-04 DIAGNOSIS — J301 Allergic rhinitis due to pollen: Secondary | ICD-10-CM | POA: Diagnosis not present

## 2022-09-04 DIAGNOSIS — J3081 Allergic rhinitis due to animal (cat) (dog) hair and dander: Secondary | ICD-10-CM | POA: Diagnosis not present

## 2022-09-04 DIAGNOSIS — J3089 Other allergic rhinitis: Secondary | ICD-10-CM | POA: Diagnosis not present

## 2022-09-18 DIAGNOSIS — J301 Allergic rhinitis due to pollen: Secondary | ICD-10-CM | POA: Diagnosis not present

## 2022-09-18 DIAGNOSIS — J3089 Other allergic rhinitis: Secondary | ICD-10-CM | POA: Diagnosis not present

## 2022-09-18 DIAGNOSIS — J3081 Allergic rhinitis due to animal (cat) (dog) hair and dander: Secondary | ICD-10-CM | POA: Diagnosis not present

## 2022-09-20 DIAGNOSIS — F3342 Major depressive disorder, recurrent, in full remission: Secondary | ICD-10-CM | POA: Diagnosis not present

## 2022-09-20 DIAGNOSIS — F4010 Social phobia, unspecified: Secondary | ICD-10-CM | POA: Diagnosis not present

## 2022-09-25 DIAGNOSIS — J3089 Other allergic rhinitis: Secondary | ICD-10-CM | POA: Diagnosis not present

## 2022-09-25 DIAGNOSIS — J301 Allergic rhinitis due to pollen: Secondary | ICD-10-CM | POA: Diagnosis not present

## 2022-09-25 DIAGNOSIS — J3081 Allergic rhinitis due to animal (cat) (dog) hair and dander: Secondary | ICD-10-CM | POA: Diagnosis not present

## 2022-10-02 DIAGNOSIS — J3089 Other allergic rhinitis: Secondary | ICD-10-CM | POA: Diagnosis not present

## 2022-10-02 DIAGNOSIS — J3081 Allergic rhinitis due to animal (cat) (dog) hair and dander: Secondary | ICD-10-CM | POA: Diagnosis not present

## 2022-10-02 DIAGNOSIS — J301 Allergic rhinitis due to pollen: Secondary | ICD-10-CM | POA: Diagnosis not present

## 2022-10-09 DIAGNOSIS — J3089 Other allergic rhinitis: Secondary | ICD-10-CM | POA: Diagnosis not present

## 2022-10-09 DIAGNOSIS — J3081 Allergic rhinitis due to animal (cat) (dog) hair and dander: Secondary | ICD-10-CM | POA: Diagnosis not present

## 2022-10-09 DIAGNOSIS — J301 Allergic rhinitis due to pollen: Secondary | ICD-10-CM | POA: Diagnosis not present

## 2022-10-15 DIAGNOSIS — J301 Allergic rhinitis due to pollen: Secondary | ICD-10-CM | POA: Diagnosis not present

## 2022-10-15 DIAGNOSIS — J3089 Other allergic rhinitis: Secondary | ICD-10-CM | POA: Diagnosis not present

## 2022-10-15 DIAGNOSIS — J3081 Allergic rhinitis due to animal (cat) (dog) hair and dander: Secondary | ICD-10-CM | POA: Diagnosis not present

## 2022-11-13 DIAGNOSIS — J301 Allergic rhinitis due to pollen: Secondary | ICD-10-CM | POA: Diagnosis not present

## 2022-11-13 DIAGNOSIS — J3081 Allergic rhinitis due to animal (cat) (dog) hair and dander: Secondary | ICD-10-CM | POA: Diagnosis not present

## 2022-11-13 DIAGNOSIS — J3089 Other allergic rhinitis: Secondary | ICD-10-CM | POA: Diagnosis not present

## 2022-11-20 DIAGNOSIS — J301 Allergic rhinitis due to pollen: Secondary | ICD-10-CM | POA: Diagnosis not present

## 2022-11-20 DIAGNOSIS — J3089 Other allergic rhinitis: Secondary | ICD-10-CM | POA: Diagnosis not present

## 2022-11-20 DIAGNOSIS — J3081 Allergic rhinitis due to animal (cat) (dog) hair and dander: Secondary | ICD-10-CM | POA: Diagnosis not present

## 2022-11-27 DIAGNOSIS — J3081 Allergic rhinitis due to animal (cat) (dog) hair and dander: Secondary | ICD-10-CM | POA: Diagnosis not present

## 2022-11-27 DIAGNOSIS — J3089 Other allergic rhinitis: Secondary | ICD-10-CM | POA: Diagnosis not present

## 2022-11-27 DIAGNOSIS — J301 Allergic rhinitis due to pollen: Secondary | ICD-10-CM | POA: Diagnosis not present

## 2022-12-05 DIAGNOSIS — J301 Allergic rhinitis due to pollen: Secondary | ICD-10-CM | POA: Diagnosis not present

## 2022-12-05 DIAGNOSIS — J3081 Allergic rhinitis due to animal (cat) (dog) hair and dander: Secondary | ICD-10-CM | POA: Diagnosis not present

## 2022-12-05 DIAGNOSIS — J3089 Other allergic rhinitis: Secondary | ICD-10-CM | POA: Diagnosis not present

## 2022-12-20 DIAGNOSIS — Z86018 Personal history of other benign neoplasm: Secondary | ICD-10-CM | POA: Diagnosis not present

## 2022-12-20 DIAGNOSIS — D485 Neoplasm of uncertain behavior of skin: Secondary | ICD-10-CM | POA: Diagnosis not present

## 2022-12-20 DIAGNOSIS — L570 Actinic keratosis: Secondary | ICD-10-CM | POA: Diagnosis not present

## 2022-12-20 DIAGNOSIS — L821 Other seborrheic keratosis: Secondary | ICD-10-CM | POA: Diagnosis not present

## 2022-12-20 DIAGNOSIS — D0462 Carcinoma in situ of skin of left upper limb, including shoulder: Secondary | ICD-10-CM | POA: Diagnosis not present

## 2022-12-20 DIAGNOSIS — D2271 Melanocytic nevi of right lower limb, including hip: Secondary | ICD-10-CM | POA: Diagnosis not present

## 2022-12-20 DIAGNOSIS — Z85828 Personal history of other malignant neoplasm of skin: Secondary | ICD-10-CM | POA: Diagnosis not present

## 2022-12-20 DIAGNOSIS — L578 Other skin changes due to chronic exposure to nonionizing radiation: Secondary | ICD-10-CM | POA: Diagnosis not present

## 2022-12-20 DIAGNOSIS — D225 Melanocytic nevi of trunk: Secondary | ICD-10-CM | POA: Diagnosis not present

## 2022-12-20 DIAGNOSIS — Z808 Family history of malignant neoplasm of other organs or systems: Secondary | ICD-10-CM | POA: Diagnosis not present

## 2022-12-20 DIAGNOSIS — D2261 Melanocytic nevi of right upper limb, including shoulder: Secondary | ICD-10-CM | POA: Diagnosis not present

## 2022-12-20 DIAGNOSIS — L820 Inflamed seborrheic keratosis: Secondary | ICD-10-CM | POA: Diagnosis not present

## 2022-12-27 DIAGNOSIS — J3089 Other allergic rhinitis: Secondary | ICD-10-CM | POA: Diagnosis not present

## 2022-12-27 DIAGNOSIS — J301 Allergic rhinitis due to pollen: Secondary | ICD-10-CM | POA: Diagnosis not present

## 2022-12-27 DIAGNOSIS — J3081 Allergic rhinitis due to animal (cat) (dog) hair and dander: Secondary | ICD-10-CM | POA: Diagnosis not present

## 2023-01-01 DIAGNOSIS — J301 Allergic rhinitis due to pollen: Secondary | ICD-10-CM | POA: Diagnosis not present

## 2023-01-01 DIAGNOSIS — D0462 Carcinoma in situ of skin of left upper limb, including shoulder: Secondary | ICD-10-CM | POA: Diagnosis not present

## 2023-01-01 DIAGNOSIS — J3089 Other allergic rhinitis: Secondary | ICD-10-CM | POA: Diagnosis not present

## 2023-01-01 DIAGNOSIS — J3081 Allergic rhinitis due to animal (cat) (dog) hair and dander: Secondary | ICD-10-CM | POA: Diagnosis not present

## 2023-01-08 DIAGNOSIS — J3081 Allergic rhinitis due to animal (cat) (dog) hair and dander: Secondary | ICD-10-CM | POA: Diagnosis not present

## 2023-01-08 DIAGNOSIS — J3089 Other allergic rhinitis: Secondary | ICD-10-CM | POA: Diagnosis not present

## 2023-01-08 DIAGNOSIS — J301 Allergic rhinitis due to pollen: Secondary | ICD-10-CM | POA: Diagnosis not present

## 2023-01-18 DIAGNOSIS — J3089 Other allergic rhinitis: Secondary | ICD-10-CM | POA: Diagnosis not present

## 2023-01-18 DIAGNOSIS — J3081 Allergic rhinitis due to animal (cat) (dog) hair and dander: Secondary | ICD-10-CM | POA: Diagnosis not present

## 2023-01-18 DIAGNOSIS — J301 Allergic rhinitis due to pollen: Secondary | ICD-10-CM | POA: Diagnosis not present

## 2023-01-22 DIAGNOSIS — E785 Hyperlipidemia, unspecified: Secondary | ICD-10-CM | POA: Diagnosis not present

## 2023-01-22 DIAGNOSIS — R7989 Other specified abnormal findings of blood chemistry: Secondary | ICD-10-CM | POA: Diagnosis not present

## 2023-01-22 DIAGNOSIS — K219 Gastro-esophageal reflux disease without esophagitis: Secondary | ICD-10-CM | POA: Diagnosis not present

## 2023-01-22 DIAGNOSIS — E559 Vitamin D deficiency, unspecified: Secondary | ICD-10-CM | POA: Diagnosis not present

## 2023-01-22 DIAGNOSIS — Z1212 Encounter for screening for malignant neoplasm of rectum: Secondary | ICD-10-CM | POA: Diagnosis not present

## 2023-01-22 DIAGNOSIS — R7301 Impaired fasting glucose: Secondary | ICD-10-CM | POA: Diagnosis not present

## 2023-01-23 DIAGNOSIS — J3089 Other allergic rhinitis: Secondary | ICD-10-CM | POA: Diagnosis not present

## 2023-01-23 DIAGNOSIS — J301 Allergic rhinitis due to pollen: Secondary | ICD-10-CM | POA: Diagnosis not present

## 2023-01-23 DIAGNOSIS — J3081 Allergic rhinitis due to animal (cat) (dog) hair and dander: Secondary | ICD-10-CM | POA: Diagnosis not present

## 2023-01-29 DIAGNOSIS — Z0000 Encounter for general adult medical examination without abnormal findings: Secondary | ICD-10-CM | POA: Diagnosis not present

## 2023-01-29 DIAGNOSIS — J309 Allergic rhinitis, unspecified: Secondary | ICD-10-CM | POA: Diagnosis not present

## 2023-01-29 DIAGNOSIS — E785 Hyperlipidemia, unspecified: Secondary | ICD-10-CM | POA: Diagnosis not present

## 2023-01-29 DIAGNOSIS — Z85828 Personal history of other malignant neoplasm of skin: Secondary | ICD-10-CM | POA: Diagnosis not present

## 2023-01-29 DIAGNOSIS — R82998 Other abnormal findings in urine: Secondary | ICD-10-CM | POA: Diagnosis not present

## 2023-01-29 DIAGNOSIS — R202 Paresthesia of skin: Secondary | ICD-10-CM | POA: Diagnosis not present

## 2023-01-29 DIAGNOSIS — G629 Polyneuropathy, unspecified: Secondary | ICD-10-CM | POA: Diagnosis not present

## 2023-01-29 DIAGNOSIS — Z1339 Encounter for screening examination for other mental health and behavioral disorders: Secondary | ICD-10-CM | POA: Diagnosis not present

## 2023-01-29 DIAGNOSIS — H6123 Impacted cerumen, bilateral: Secondary | ICD-10-CM | POA: Diagnosis not present

## 2023-01-29 DIAGNOSIS — I471 Supraventricular tachycardia, unspecified: Secondary | ICD-10-CM | POA: Diagnosis not present

## 2023-01-29 DIAGNOSIS — R7301 Impaired fasting glucose: Secondary | ICD-10-CM | POA: Diagnosis not present

## 2023-01-29 DIAGNOSIS — Z1331 Encounter for screening for depression: Secondary | ICD-10-CM | POA: Diagnosis not present

## 2023-01-29 DIAGNOSIS — K589 Irritable bowel syndrome without diarrhea: Secondary | ICD-10-CM | POA: Diagnosis not present

## 2023-01-29 DIAGNOSIS — M8580 Other specified disorders of bone density and structure, unspecified site: Secondary | ICD-10-CM | POA: Diagnosis not present

## 2023-01-31 DIAGNOSIS — J3089 Other allergic rhinitis: Secondary | ICD-10-CM | POA: Diagnosis not present

## 2023-01-31 DIAGNOSIS — J3081 Allergic rhinitis due to animal (cat) (dog) hair and dander: Secondary | ICD-10-CM | POA: Diagnosis not present

## 2023-01-31 DIAGNOSIS — J301 Allergic rhinitis due to pollen: Secondary | ICD-10-CM | POA: Diagnosis not present

## 2023-02-04 ENCOUNTER — Encounter: Payer: Self-pay | Admitting: Neurology

## 2023-02-05 DIAGNOSIS — H6121 Impacted cerumen, right ear: Secondary | ICD-10-CM | POA: Diagnosis not present

## 2023-02-05 DIAGNOSIS — J3081 Allergic rhinitis due to animal (cat) (dog) hair and dander: Secondary | ICD-10-CM | POA: Diagnosis not present

## 2023-02-05 DIAGNOSIS — H9193 Unspecified hearing loss, bilateral: Secondary | ICD-10-CM | POA: Diagnosis not present

## 2023-02-05 DIAGNOSIS — J3089 Other allergic rhinitis: Secondary | ICD-10-CM | POA: Diagnosis not present

## 2023-02-05 DIAGNOSIS — J301 Allergic rhinitis due to pollen: Secondary | ICD-10-CM | POA: Diagnosis not present

## 2023-02-12 DIAGNOSIS — J3081 Allergic rhinitis due to animal (cat) (dog) hair and dander: Secondary | ICD-10-CM | POA: Diagnosis not present

## 2023-02-12 DIAGNOSIS — J3089 Other allergic rhinitis: Secondary | ICD-10-CM | POA: Diagnosis not present

## 2023-02-12 DIAGNOSIS — J301 Allergic rhinitis due to pollen: Secondary | ICD-10-CM | POA: Diagnosis not present

## 2023-02-19 DIAGNOSIS — J3089 Other allergic rhinitis: Secondary | ICD-10-CM | POA: Diagnosis not present

## 2023-02-19 DIAGNOSIS — J3081 Allergic rhinitis due to animal (cat) (dog) hair and dander: Secondary | ICD-10-CM | POA: Diagnosis not present

## 2023-02-19 DIAGNOSIS — J301 Allergic rhinitis due to pollen: Secondary | ICD-10-CM | POA: Diagnosis not present

## 2023-02-20 NOTE — Progress Notes (Signed)
Initial neurology clinic note  SERVICE DATE: 03/01/23  Reason for Evaluation: Consultation requested by Crist Infante, MD for an opinion regarding pain in feet. My final recommendations will be communicated back to the requesting physician by way of shared medical record or letter to requesting physician via [place] mail.  HPI: This is [name], a 77 y.o. left-handed female with a medical history of GERD, HLD, PSVT, depression, OA, osteopenia who presents to neurology clinic with the chief complaint of pain in feet. The patient is alone today.  Patient has stinging and burning pain in her feet, especially if not wearing comfortable tennis shoes. She thinks symptoms have been present for about 1 year. It has been worse over the last 6 months. She denies weakness in her legs. She denies imbalance or falls. Patient does have chronic low back pain but with no radiation into her legs. She did PT for this last year that helped. She denies symptoms in her hands.  She denies muscle cramps.  The patient does not report symptoms referable to autonomic dysfunction including impaired sweating, heat or cold intolerance, excessive mucosal dryness, gastroparetic early satiety, postprandial abdominal bloating, constipation, bowel or bladder dyscontrol, or syncope/presyncope/orthostatic intolerance.  The patient has not noticed any recent skin rashes nor does she report any constitutional symptoms like fever, night sweats, anorexia or unintentional weight loss.  EtOH use: 2 glasses per night of wine  Restrictive diet? No Family history of neuropathy/myopathy/neurologic disease? No  Of note, patient is on paxil for depression.  She has never had an EMG and not been treated for her symptoms.   MEDICATIONS:  Outpatient Encounter Medications as of 03/01/2023  Medication Sig   CRESTOR 20 MG tablet Take 10 mg by mouth daily.    Fexofenadine HCl (ALLEGRA PO) Take by mouth daily.   fluticasone (FLONASE) 50  MCG/ACT nasal spray Place 2 sprays into the nose daily.    Multiple Vitamin (MULTIVITAMIN) tablet Take 1 tablet by mouth daily.   Omega-3 Fatty Acids (FISH OIL BURP-LESS PO) Take by mouth. Takes 2 tablets twice daily   OVER THE COUNTER MEDICATION [employer] Fiber 2 capsules daily.   OVER THE COUNTER MEDICATION Magnesium 500 mg, one capsule daily.   pantoprazole (PROTONIX) 40 MG tablet Take 40 mg by mouth daily.   PARoxetine (PAXIL) 10 MG tablet Take 10 mg by mouth daily.    traZODone (DESYREL) 50 MG tablet Take 50 mg by mouth at bedtime. Takes half tablet at bedtime   verapamil (CALAN-SR) 180 MG CR tablet Take 180 mg by mouth daily.    No facility-administered encounter medications on file as of 03/01/2023.    PAST MEDICAL HISTORY: Past Medical History:  Diagnosis Date   Abnormal LFTs    Allergy    SEASONAL   Anxiety    Arthritis    Cancer ([place])    SKIN   Cataract    REMOVED   Colon polyp    adenomatous   Depression    Environmental allergies    GERD (gastroesophageal reflux disease)    Hx of skin cancer, basal cell 03/2016   Hyperlipidemia    IBS (irritable bowel syndrome)    IFG (impaired fasting glucose)    Osteopenia    SVT (supraventricular tachycardia)    Vitamin D deficiency     PAST SURGICAL HISTORY: Past Surgical History:  Procedure Laterality Date   basal cell removal     cataract removal     bilateral   COLONOSCOPY  2002   DILATION AND CURETTAGE OF UTERUS     MOHS SURGERY     rt side of nose   MULTIPLE TOOTH EXTRACTIONS  2012    ALLERGIES: No Known Allergies  FAMILY HISTORY: Family History  Problem Relation Age of Onset   Lung cancer Mother    Hypertension Brother    Hemochromatosis Brother    Kidney disease Father    Colon cancer Neg Hx    Stomach cancer Neg Hx    Esophageal cancer Neg Hx    Rectal cancer Neg Hx     SOCIAL HISTORY: Social History   Tobacco Use   Smoking status: Never   Smokeless tobacco: Never  Vaping Use   Vaping Use:  Never used  Substance Use Topics   Alcohol use: Yes    Alcohol/week: 14.0 standard drinks of alcohol    Types: 14 Glasses of wine per week    Comment: 2 glasses a night   Drug use: No    Comment: hemp gummys   Social History   Social History Narrative   Are you right handed or left handed? Left handed   Are you currently employed ?    What is your current occupation? retired   Do you live at home alone? yes   Who lives with you?    What type of home do you live in: 1 story or 2 story? two    Caffeine none     OBJECTIVE: PHYSICAL EXAM: BP 130/86   Pulse 86   Ht 5\' 6"  (1.676 m)   Wt 174 lb (78.9 kg)   SpO2 95%   BMI 28.08 kg/m   General: General appearance: Awake and alert. No distress. Cooperative with exam.  Skin: No obvious rash or jaundice. HEENT: Atraumatic. Anicteric. Lungs: Non-labored breathing on room air  Extremities: No edema. No obvious deformity.  Musculoskeletal: No obvious joint swelling. Psych: Affect appropriate.  Neurological: Mental Status: Alert. Speech fluent. No pseudobulbar affect Cranial Nerves: CNII: No RAPD. Visual fields grossly intact. CNIII, IV, VI: PERRL. No nystagmus. EOMI. CN V: Facial sensation intact bilaterally to fine touch. CN VII: Facial muscles symmetric and strong. No ptosis at rest. CN VIII: Hearing grossly intact bilaterally. CN IX: No hypophonia. CN X: Palate elevates symmetrically. CN XI: Full strength shoulder shrug bilaterally. CN XII: Tongue protrusion full and midline. No atrophy or fasciculations. No significant dysarthria Motor: Tone is normal.  Individual muscle group testing (MRC grade out of 5):  Movement     Neck flexion 5    Neck extension 5     Right Left   Shoulder abduction 5 5   Elbow flexion 5 5   Elbow extension 5 5   Finger abduction - FDI 5 5   Finger abduction - ADM 5 5   Finger extension 5 5   Finger distal flexion - 2/3 5 5    Finger distal flexion - 4/5 5 5    Thumb flexion - FPL 5 5    Thumb abduction - APB 5 5    Hip flexion 5 5   Hip extension 5 5   Hip adduction 5 5   Hip abduction 5 5   Knee extension 5 5   Knee flexion 5 5   Dorsiflexion 5 5   Plantarflexion 5 5   Great toe extension 5 5-   Great toe flexion 5 5-     Reflexes:  Right Left   Bicep 2+ 2+   Tricep 2+ 2+  BrRad 2+ 2+   Knee 2+ 2+   Ankle 2+ 2+    Pathological Reflexes: Babinski: flexor response bilaterally Hoffman: absent bilaterally Troemner: absent bilaterally Sensation: Pinprick: Intact in bilateral upper extremities; left foot 25% of normal, right foot 75% of normal, and inner aspect of right lower leg. Vibration: Intact in upper extremities. 5 seconds in right great toe, absent in left great toe; increases proximally Proprioception: Intact in bilateral great toes Coordination: Intact finger-to- nose-finger bilaterally. Romberg negative. Gait: Able to rise from chair with arms crossed unassisted. Normal, narrow-based gait.  Lab and Test Review: External labs: CMP (01/22/23): unremarkable CBC (01/22/23): unremarkable TSH( 01/22/23): 1.44 Vit D (01/22/23): 34.0 HbA1c (01/22/23): 5.1  ASSESSMENT: Alicia [name] is a 77 y.o. female who presents for evaluation of pain in bilateral feet. She has a relevant medical history of GERD, HLD, PSVT, depression, OA, osteopenia. Her neurological examination is pertinent for diminished sensation to pinprick and vibration in bilateral lower extremities (L > R). Available diagnostic data is significant for HbA1c of 5.1. The etiology of patient's symptoms is currently unclear. A peripheral neuropathy is possible. Her only known risk factor currently is moderate EtOH use (2 glasses of wine per night). Lumbosacral radiculopathy is also possible. I will work up further as below.  PLAN: -Blood work: B12, B1, IFE -EMG: PN protocol (L > R) -Lidocaine cream PRN -Discussed gabapentin, but patient will defer for now  -Return to clinic to be determined  The  impression above as well as the plan as outlined below were extensively discussed with the patient who voiced understanding. All questions were answered to their satisfaction.  When available, results of the above investigations and possible further recommendations will be communicated to the patient via telephone/MyChart. Patient to call office if not contacted after expected testing turnaround time.   Total time spent reviewing records, interview, history/exam, documentation, and coordination of care on day of encounter:  45 min   Thank you for allowing me to participate in patient's care.  If I can answer any additional questions, I would be pleased to do so.  Kai Levins, MD   CC: Crist Infante, MD [place] [id number]  [place]: Referring provider: Crist Infante, [place],  [place] [address]

## 2023-02-26 DIAGNOSIS — J301 Allergic rhinitis due to pollen: Secondary | ICD-10-CM | POA: Diagnosis not present

## 2023-02-26 DIAGNOSIS — J3089 Other allergic rhinitis: Secondary | ICD-10-CM | POA: Diagnosis not present

## 2023-02-26 DIAGNOSIS — J3081 Allergic rhinitis due to animal (cat) (dog) hair and dander: Secondary | ICD-10-CM | POA: Diagnosis not present

## 2023-03-01 ENCOUNTER — Encounter: Payer: Self-pay | Admitting: Neurology

## 2023-03-01 ENCOUNTER — Ambulatory Visit (INDEPENDENT_AMBULATORY_CARE_PROVIDER_SITE_OTHER): Payer: Medicare Other | Admitting: Neurology

## 2023-03-01 ENCOUNTER — Other Ambulatory Visit (INDEPENDENT_AMBULATORY_CARE_PROVIDER_SITE_OTHER): Payer: Medicare Other

## 2023-03-01 VITALS — BP 130/86 | HR 86 | Ht 66.0 in | Wt 174.0 lb

## 2023-03-01 DIAGNOSIS — R209 Unspecified disturbances of skin sensation: Secondary | ICD-10-CM

## 2023-03-01 DIAGNOSIS — M79671 Pain in right foot: Secondary | ICD-10-CM

## 2023-03-01 DIAGNOSIS — M79672 Pain in left foot: Secondary | ICD-10-CM | POA: Diagnosis not present

## 2023-03-01 LAB — VITAMIN B12: Vitamin B-12: 480 pg/mL (ref 211–911)

## 2023-03-01 NOTE — Patient Instructions (Signed)
I saw you today for pain in your feet. I want to evaluate your symptoms further with the following: -Blood work today -Muscle and nerve test called EMG (see more information below)  I will be in touch when I have your results to discuss next steps.  You can also try Lidocaine cream as needed. Apply wear you have pain, tingling, or burning. Wear gloves to prevent your hands being numb. This can be bought over the counter at any drug store or online.  If lidocaine is not working, we can discuss a medication for nerve pain such as gabapentin.  The physicians and staff at [facility] are committed to providing excellent care. You may receive a survey requesting feedback about your experience at our office. We strive to receive "very good" responses to the survey questions. If you feel that your experience would prevent you from giving the office a "very good " response, please contact our office to try to remedy the situation. We may be reached at [phone]. Thank you for taking the time out of your busy day to complete the survey.  Kai Levins, MD [facility]  ELECTROMYOGRAM AND NERVE CONDUCTION STUDIES (EMG/NCS) INSTRUCTIONS  How to Prepare The neurologist conducting the EMG will need to know if you have certain medical conditions. Tell the neurologist and other EMG lab personnel if you: Have a pacemaker or any other electrical medical device Take blood-thinning medications Have hemophilia, a blood-clotting disorder that causes prolonged bleeding Bathing Take a shower or bath shortly before your exam in order to remove oils from your skin. Don't apply lotions or creams before the exam.  What to Expect You'll likely be asked to change into a hospital gown for the procedure and lie down on an examination table. The following explanations can help you understand what will happen during the exam.  Electrodes. The neurologist or a technician places surface electrodes at various  locations on your skin depending on where you're experiencing symptoms. Or the neurologist may insert needle electrodes at different sites depending on your symptoms.  Sensations. The electrodes will at times transmit a tiny electrical current that you may feel as a twinge or spasm. The needle electrode may cause discomfort or pain that usually ends shortly after the needle is removed. If you are concerned about discomfort or pain, you may want to talk to the neurologist about taking a short break during the exam.  Instructions. During the needle EMG, the neurologist will assess whether there is any spontaneous electrical activity when the muscle is at rest - activity that isn't present in healthy muscle tissue - and the degree of activity when you slightly contract the muscle.  He or she will give you instructions on resting and contracting a muscle at appropriate times. Depending on what muscles and nerves the neurologist is examining, he or she may ask you to change positions during the exam.  After your EMG You may experience some temporary, minor bruising where the needle electrode was inserted into your muscle. This bruising should fade within several days. If it persists, contact your primary care doctor.

## 2023-03-05 ENCOUNTER — Other Ambulatory Visit: Payer: Self-pay | Admitting: Internal Medicine

## 2023-03-05 DIAGNOSIS — J3081 Allergic rhinitis due to animal (cat) (dog) hair and dander: Secondary | ICD-10-CM | POA: Diagnosis not present

## 2023-03-05 DIAGNOSIS — R202 Paresthesia of skin: Secondary | ICD-10-CM | POA: Diagnosis not present

## 2023-03-05 DIAGNOSIS — Z1231 Encounter for screening mammogram for malignant neoplasm of breast: Secondary | ICD-10-CM

## 2023-03-05 DIAGNOSIS — J301 Allergic rhinitis due to pollen: Secondary | ICD-10-CM | POA: Diagnosis not present

## 2023-03-05 DIAGNOSIS — J3089 Other allergic rhinitis: Secondary | ICD-10-CM | POA: Diagnosis not present

## 2023-03-07 LAB — VITAMIN B1: Vitamin B1 (Thiamine): 15 nmol/L (ref 8–30)

## 2023-03-07 LAB — IMMUNOFIXATION ELECTROPHORESIS
IgG (Immunoglobin G), Serum: 1070 mg/dL (ref 600–1540)
IgM, Serum: 86 mg/dL (ref 50–300)
Immunoglobulin A: 224 mg/dL (ref 70–320)

## 2023-03-21 DIAGNOSIS — J3081 Allergic rhinitis due to animal (cat) (dog) hair and dander: Secondary | ICD-10-CM | POA: Diagnosis not present

## 2023-03-21 DIAGNOSIS — J3089 Other allergic rhinitis: Secondary | ICD-10-CM | POA: Diagnosis not present

## 2023-03-21 DIAGNOSIS — J301 Allergic rhinitis due to pollen: Secondary | ICD-10-CM | POA: Diagnosis not present

## 2023-03-25 ENCOUNTER — Ambulatory Visit (INDEPENDENT_AMBULATORY_CARE_PROVIDER_SITE_OTHER): Payer: Medicare Other | Admitting: Neurology

## 2023-03-25 ENCOUNTER — Telehealth: Payer: Self-pay | Admitting: Neurology

## 2023-03-25 DIAGNOSIS — R209 Unspecified disturbances of skin sensation: Secondary | ICD-10-CM

## 2023-03-25 DIAGNOSIS — M79671 Pain in right foot: Secondary | ICD-10-CM

## 2023-03-25 NOTE — Procedures (Signed)
  [facility]  [address], [address]  [place], [place] [address] Tel: [phone] Fax: [phone] Test Date:  03/25/2023  Patient: Alicia [name] DOB: 04-17-1946 Physician: Jacquelyne Balint, MD  Sex: Female Height: 5\' 6"  Ref Phys: Jacquelyne Balint, MD  ID#: [id number]   Technician:    History: This is a 77 year old female with burning pain in her feet.  NCV & EMG Findings: Extensive electrodiagnostic evaluation of the left lower extremity shows: Left sural sensory response is within normal limits. Left superficial peroneal/fibular sensory response is absent. Left peroneal/fibular (EDB) and tibial (AH) motor responses are within normal limits. Left H reflex is absent There is no evidence of active or chronic motor axon loss changes affecting any of the tested muscles on needle examination. Motor unit configuration and recruitment pattern is within normal limits. There is no abnormal spontaneous activity in intrinsic foot muscles.  Impression: This study shows no significant abnormalities. Specifically: No definitive electrodiagnostic evidence of a large fiber sensorimotor neuropathy. Absent superficial peroneal/fibular sensory response and H reflex can be normal for age. No electrodiagnostic evidence of a left lumbosacral (L3-S1) motor radiculopathy.    ___________________________ Jacquelyne Balint, MD    Nerve Conduction Studies Motor Nerve Results    Latency Amplitude F-Lat Segment Distance CV Comment  Site (ms) Norm (mV) Norm (ms)  (cm) (m/s) Norm   Left Fibular (EDB) Motor  Ankle 3.3  < 6.0 6.1  > 2.5        Bel fib head 9.9 - 5.3 -  Bel fib head-Ankle 32 48  > 40   Pop fossa 12.2 - 5.3 -  Pop fossa-Bel fib head 10 43 -   Left Tibial (AH) Motor  Ankle 3.0  < 6.0 9.5  > 4.0        Knee 11.7 - 7.5 -  Knee-Ankle 40 46  > 40    Sensory Sites    Neg Peak Lat Amplitude (O-P) Segment Distance Velocity Comment  Site (ms) Norm (V) Norm  (cm) (ms)   Left Superficial Fibular  Sensory  14 cm-Ankle *NR  < 4.6 *NR  > 3 14 cm-Ankle 14    Left Sural Sensory  Calf-Lat mall 3.7  < 4.6 6  > 3 Calf-Lat mall 14     H-Reflex Results    M-Lat H Lat H Neg Amp H-M Lat  Site (ms) (ms) Norm (mV) (ms)  Left Tibial H-Reflex  Pop fossa 4.7 ---  < 35.0 --- ---   Electromyography   Side Muscle Ins.Act Fibs Fasc Recrt Amp Dur Poly Activation Comment  Left Tib ant Nml Nml Nml Nml Nml Nml Nml Nml N/A  Left Gastroc MH Nml Nml Nml Nml Nml Nml Nml Nml N/A  Left EDB Nml Nml Nml N.E. --- --- --- N.E. N/A  Left AH Nml Nml Nml N.E. --- --- --- N.E. N/A  Left Rectus fem Nml Nml Nml Nml Nml Nml Nml Nml N/A  Left Biceps fem SH Nml Nml Nml Nml Nml Nml Nml Nml N/A  Left Gluteus med Nml Nml Nml Nml Nml Nml Nml Nml N/A      Waveforms:  Motor      Sensory      H-Reflex

## 2023-03-25 NOTE — Telephone Encounter (Signed)
Discussed the results of patient's EMG after the procedure today. EMG was essentially normal without evidence of a large fiber neuropathy or radiculopathy.  We discussed that symptoms could be due to a small fiber neuropathy, and would need to evaluate this with skin biopsy. Patient agreed.  All questions were answered.  Jacquelyne Balint, MD [facility]

## 2023-03-27 ENCOUNTER — Telehealth: Payer: Self-pay

## 2023-03-27 NOTE — Telephone Encounter (Signed)
[employer] called and PA was approved

## 2023-03-27 NOTE — Addendum Note (Signed)
Addended by: Lenise Herald on: 03/27/2023 04:15 PM   Modules accepted: Orders

## 2023-03-28 DIAGNOSIS — S40861A Insect bite (nonvenomous) of right upper arm, initial encounter: Secondary | ICD-10-CM | POA: Diagnosis not present

## 2023-03-28 DIAGNOSIS — W57XXXA Bitten or stung by nonvenomous insect and other nonvenomous arthropods, initial encounter: Secondary | ICD-10-CM | POA: Diagnosis not present

## 2023-04-02 DIAGNOSIS — J3081 Allergic rhinitis due to animal (cat) (dog) hair and dander: Secondary | ICD-10-CM | POA: Diagnosis not present

## 2023-04-02 DIAGNOSIS — J3089 Other allergic rhinitis: Secondary | ICD-10-CM | POA: Diagnosis not present

## 2023-04-02 DIAGNOSIS — J301 Allergic rhinitis due to pollen: Secondary | ICD-10-CM | POA: Diagnosis not present

## 2023-04-10 DIAGNOSIS — J3089 Other allergic rhinitis: Secondary | ICD-10-CM | POA: Diagnosis not present

## 2023-04-10 DIAGNOSIS — J3081 Allergic rhinitis due to animal (cat) (dog) hair and dander: Secondary | ICD-10-CM | POA: Diagnosis not present

## 2023-04-10 DIAGNOSIS — J301 Allergic rhinitis due to pollen: Secondary | ICD-10-CM | POA: Diagnosis not present

## 2023-04-16 DIAGNOSIS — J3081 Allergic rhinitis due to animal (cat) (dog) hair and dander: Secondary | ICD-10-CM | POA: Diagnosis not present

## 2023-04-16 DIAGNOSIS — J3089 Other allergic rhinitis: Secondary | ICD-10-CM | POA: Diagnosis not present

## 2023-04-16 DIAGNOSIS — J301 Allergic rhinitis due to pollen: Secondary | ICD-10-CM | POA: Diagnosis not present

## 2023-04-18 ENCOUNTER — Ambulatory Visit
Admission: RE | Admit: 2023-04-18 | Discharge: 2023-04-18 | Disposition: A | Discharge status: Home / Self Care | Payer: Medicare Other | Source: Ambulatory Visit | Attending: Internal Medicine | Admitting: Internal Medicine

## 2023-04-18 DIAGNOSIS — Z1231 Encounter for screening mammogram for malignant neoplasm of breast: Secondary | ICD-10-CM | POA: Diagnosis not present

## 2023-04-23 ENCOUNTER — Other Ambulatory Visit: Payer: Self-pay | Admitting: Internal Medicine

## 2023-04-23 DIAGNOSIS — J301 Allergic rhinitis due to pollen: Secondary | ICD-10-CM | POA: Diagnosis not present

## 2023-04-23 DIAGNOSIS — J3081 Allergic rhinitis due to animal (cat) (dog) hair and dander: Secondary | ICD-10-CM | POA: Diagnosis not present

## 2023-04-23 DIAGNOSIS — J3089 Other allergic rhinitis: Secondary | ICD-10-CM | POA: Diagnosis not present

## 2023-04-23 DIAGNOSIS — R928 Other abnormal and inconclusive findings on diagnostic imaging of breast: Secondary | ICD-10-CM

## 2023-04-27 ENCOUNTER — Other Ambulatory Visit: Payer: Self-pay | Admitting: Internal Medicine

## 2023-04-27 ENCOUNTER — Ambulatory Visit
Admission: RE | Admit: 2023-04-27 | Discharge: 2023-04-27 | Disposition: A | Discharge status: Home / Self Care | Payer: Medicare Other | Source: Ambulatory Visit | Attending: Internal Medicine | Admitting: Internal Medicine

## 2023-04-27 DIAGNOSIS — N6320 Unspecified lump in the left breast, unspecified quadrant: Secondary | ICD-10-CM

## 2023-04-27 DIAGNOSIS — N6002 Solitary cyst of left breast: Secondary | ICD-10-CM | POA: Diagnosis not present

## 2023-04-27 DIAGNOSIS — R928 Other abnormal and inconclusive findings on diagnostic imaging of breast: Secondary | ICD-10-CM

## 2023-04-30 ENCOUNTER — Ambulatory Visit (INDEPENDENT_AMBULATORY_CARE_PROVIDER_SITE_OTHER): Payer: Medicare Other | Admitting: Neurology

## 2023-04-30 DIAGNOSIS — M79672 Pain in left foot: Secondary | ICD-10-CM

## 2023-04-30 DIAGNOSIS — J301 Allergic rhinitis due to pollen: Secondary | ICD-10-CM | POA: Diagnosis not present

## 2023-04-30 DIAGNOSIS — M79671 Pain in right foot: Secondary | ICD-10-CM | POA: Diagnosis not present

## 2023-04-30 DIAGNOSIS — G629 Polyneuropathy, unspecified: Secondary | ICD-10-CM | POA: Diagnosis not present

## 2023-04-30 DIAGNOSIS — J3089 Other allergic rhinitis: Secondary | ICD-10-CM | POA: Diagnosis not present

## 2023-04-30 DIAGNOSIS — R209 Unspecified disturbances of skin sensation: Secondary | ICD-10-CM | POA: Diagnosis not present

## 2023-04-30 DIAGNOSIS — J3081 Allergic rhinitis due to animal (cat) (dog) hair and dander: Secondary | ICD-10-CM | POA: Diagnosis not present

## 2023-04-30 DIAGNOSIS — R202 Paresthesia of skin: Secondary | ICD-10-CM | POA: Diagnosis not present

## 2023-04-30 NOTE — Progress Notes (Signed)
Punch Biopsy Procedure Note  Preprocedure Diagnosis: disturbance of skin sensation   Postprocedure Diagnosis: same  Locations: Site 1: left lateral distal leg;  Site 2: left lateral thigh  Indications: r/o small fiber neuropathy  Anesthesia: 5 mL Lidocaine 1% with epinephrine  Procedure Details Patient informed of the risks (including but not limited to bleeding, pain, infection, scar and infection) and benefits of the procedure.  Informed consent obtained.  The areas which were chosen for biopsy, as above, and surrounding areas were given a sterile prep using alcohol and iodine. The skin was then stretched perpendicular to the skin tension lines and sample removed using the 3 mm punch. Pressure applied, hemostasis achieved.   Dressing applied. The specimen(s) was sent for pathologic examination. The patient tolerated the procedure well.  Estimated Blood Loss: 1 ml  Condition: Stable  Complications: none.  Plan: 1. Instructed to keep the wound dry and covered for 24h and clean thereafter. 2. Warning signs of infection were reviewed.    Jacquelyne Balint, MD [facility]

## 2023-05-07 DIAGNOSIS — J3089 Other allergic rhinitis: Secondary | ICD-10-CM | POA: Diagnosis not present

## 2023-05-07 DIAGNOSIS — J3081 Allergic rhinitis due to animal (cat) (dog) hair and dander: Secondary | ICD-10-CM | POA: Diagnosis not present

## 2023-05-07 DIAGNOSIS — J301 Allergic rhinitis due to pollen: Secondary | ICD-10-CM | POA: Diagnosis not present

## 2023-05-08 ENCOUNTER — Encounter: Payer: Self-pay | Admitting: Neurology

## 2023-05-14 DIAGNOSIS — J3089 Other allergic rhinitis: Secondary | ICD-10-CM | POA: Diagnosis not present

## 2023-05-14 DIAGNOSIS — J3081 Allergic rhinitis due to animal (cat) (dog) hair and dander: Secondary | ICD-10-CM | POA: Diagnosis not present

## 2023-05-14 DIAGNOSIS — J301 Allergic rhinitis due to pollen: Secondary | ICD-10-CM | POA: Diagnosis not present

## 2023-05-21 DIAGNOSIS — J3081 Allergic rhinitis due to animal (cat) (dog) hair and dander: Secondary | ICD-10-CM | POA: Diagnosis not present

## 2023-05-21 DIAGNOSIS — K219 Gastro-esophageal reflux disease without esophagitis: Secondary | ICD-10-CM | POA: Diagnosis not present

## 2023-05-21 DIAGNOSIS — J301 Allergic rhinitis due to pollen: Secondary | ICD-10-CM | POA: Diagnosis not present

## 2023-05-21 DIAGNOSIS — J3089 Other allergic rhinitis: Secondary | ICD-10-CM | POA: Diagnosis not present

## 2023-05-30 DIAGNOSIS — J301 Allergic rhinitis due to pollen: Secondary | ICD-10-CM | POA: Diagnosis not present

## 2023-05-30 DIAGNOSIS — J3081 Allergic rhinitis due to animal (cat) (dog) hair and dander: Secondary | ICD-10-CM | POA: Diagnosis not present

## 2023-05-30 DIAGNOSIS — J3089 Other allergic rhinitis: Secondary | ICD-10-CM | POA: Diagnosis not present

## 2023-06-05 DIAGNOSIS — J3081 Allergic rhinitis due to animal (cat) (dog) hair and dander: Secondary | ICD-10-CM | POA: Diagnosis not present

## 2023-06-05 DIAGNOSIS — J3089 Other allergic rhinitis: Secondary | ICD-10-CM | POA: Diagnosis not present

## 2023-06-05 DIAGNOSIS — J301 Allergic rhinitis due to pollen: Secondary | ICD-10-CM | POA: Diagnosis not present

## 2023-06-11 DIAGNOSIS — J301 Allergic rhinitis due to pollen: Secondary | ICD-10-CM | POA: Diagnosis not present

## 2023-06-11 DIAGNOSIS — J3081 Allergic rhinitis due to animal (cat) (dog) hair and dander: Secondary | ICD-10-CM | POA: Diagnosis not present

## 2023-06-11 DIAGNOSIS — J3089 Other allergic rhinitis: Secondary | ICD-10-CM | POA: Diagnosis not present

## 2023-06-27 DIAGNOSIS — J3089 Other allergic rhinitis: Secondary | ICD-10-CM | POA: Diagnosis not present

## 2023-06-27 DIAGNOSIS — J3081 Allergic rhinitis due to animal (cat) (dog) hair and dander: Secondary | ICD-10-CM | POA: Diagnosis not present

## 2023-06-27 DIAGNOSIS — J301 Allergic rhinitis due to pollen: Secondary | ICD-10-CM | POA: Diagnosis not present

## 2023-07-08 DIAGNOSIS — H43812 Vitreous degeneration, left eye: Secondary | ICD-10-CM | POA: Diagnosis not present

## 2023-07-08 DIAGNOSIS — H40013 Open angle with borderline findings, low risk, bilateral: Secondary | ICD-10-CM | POA: Diagnosis not present

## 2023-07-08 DIAGNOSIS — H52203 Unspecified astigmatism, bilateral: Secondary | ICD-10-CM | POA: Diagnosis not present

## 2023-07-08 DIAGNOSIS — H26493 Other secondary cataract, bilateral: Secondary | ICD-10-CM | POA: Diagnosis not present

## 2023-07-08 DIAGNOSIS — H524 Presbyopia: Secondary | ICD-10-CM | POA: Diagnosis not present

## 2023-07-10 DIAGNOSIS — J301 Allergic rhinitis due to pollen: Secondary | ICD-10-CM | POA: Diagnosis not present

## 2023-07-10 DIAGNOSIS — J3081 Allergic rhinitis due to animal (cat) (dog) hair and dander: Secondary | ICD-10-CM | POA: Diagnosis not present

## 2023-07-10 DIAGNOSIS — J3089 Other allergic rhinitis: Secondary | ICD-10-CM | POA: Diagnosis not present

## 2023-07-18 ENCOUNTER — Encounter: Payer: Self-pay | Admitting: Neurology

## 2023-07-23 DIAGNOSIS — J3081 Allergic rhinitis due to animal (cat) (dog) hair and dander: Secondary | ICD-10-CM | POA: Diagnosis not present

## 2023-07-23 DIAGNOSIS — J301 Allergic rhinitis due to pollen: Secondary | ICD-10-CM | POA: Diagnosis not present

## 2023-07-23 DIAGNOSIS — J3089 Other allergic rhinitis: Secondary | ICD-10-CM | POA: Diagnosis not present

## 2023-08-06 DIAGNOSIS — J3081 Allergic rhinitis due to animal (cat) (dog) hair and dander: Secondary | ICD-10-CM | POA: Diagnosis not present

## 2023-08-06 DIAGNOSIS — J3089 Other allergic rhinitis: Secondary | ICD-10-CM | POA: Diagnosis not present

## 2023-08-06 DIAGNOSIS — J301 Allergic rhinitis due to pollen: Secondary | ICD-10-CM | POA: Diagnosis not present

## 2023-08-20 DIAGNOSIS — J3081 Allergic rhinitis due to animal (cat) (dog) hair and dander: Secondary | ICD-10-CM | POA: Diagnosis not present

## 2023-08-20 DIAGNOSIS — J3089 Other allergic rhinitis: Secondary | ICD-10-CM | POA: Diagnosis not present

## 2023-08-20 DIAGNOSIS — J301 Allergic rhinitis due to pollen: Secondary | ICD-10-CM | POA: Diagnosis not present

## 2023-08-29 DIAGNOSIS — Z23 Encounter for immunization: Secondary | ICD-10-CM | POA: Diagnosis not present

## 2023-08-30 ENCOUNTER — Other Ambulatory Visit: Payer: Self-pay | Admitting: Internal Medicine

## 2023-08-30 DIAGNOSIS — R928 Other abnormal and inconclusive findings on diagnostic imaging of breast: Secondary | ICD-10-CM

## 2023-08-30 DIAGNOSIS — N6320 Unspecified lump in the left breast, unspecified quadrant: Secondary | ICD-10-CM

## 2023-09-03 DIAGNOSIS — J3081 Allergic rhinitis due to animal (cat) (dog) hair and dander: Secondary | ICD-10-CM | POA: Diagnosis not present

## 2023-09-03 DIAGNOSIS — J3089 Other allergic rhinitis: Secondary | ICD-10-CM | POA: Diagnosis not present

## 2023-09-03 DIAGNOSIS — J301 Allergic rhinitis due to pollen: Secondary | ICD-10-CM | POA: Diagnosis not present

## 2023-09-18 DIAGNOSIS — J3089 Other allergic rhinitis: Secondary | ICD-10-CM | POA: Diagnosis not present

## 2023-09-18 DIAGNOSIS — J301 Allergic rhinitis due to pollen: Secondary | ICD-10-CM | POA: Diagnosis not present

## 2023-09-18 DIAGNOSIS — J3081 Allergic rhinitis due to animal (cat) (dog) hair and dander: Secondary | ICD-10-CM | POA: Diagnosis not present

## 2023-10-01 DIAGNOSIS — J301 Allergic rhinitis due to pollen: Secondary | ICD-10-CM | POA: Diagnosis not present

## 2023-10-01 DIAGNOSIS — J3081 Allergic rhinitis due to animal (cat) (dog) hair and dander: Secondary | ICD-10-CM | POA: Diagnosis not present

## 2023-10-01 DIAGNOSIS — J3089 Other allergic rhinitis: Secondary | ICD-10-CM | POA: Diagnosis not present

## 2023-10-08 DIAGNOSIS — Z23 Encounter for immunization: Secondary | ICD-10-CM | POA: Diagnosis not present

## 2023-10-15 DIAGNOSIS — J3081 Allergic rhinitis due to animal (cat) (dog) hair and dander: Secondary | ICD-10-CM | POA: Diagnosis not present

## 2023-10-15 DIAGNOSIS — J301 Allergic rhinitis due to pollen: Secondary | ICD-10-CM | POA: Diagnosis not present

## 2023-10-15 DIAGNOSIS — J3089 Other allergic rhinitis: Secondary | ICD-10-CM | POA: Diagnosis not present

## 2023-10-18 DIAGNOSIS — K5900 Constipation, unspecified: Secondary | ICD-10-CM | POA: Diagnosis not present

## 2023-10-18 DIAGNOSIS — G629 Polyneuropathy, unspecified: Secondary | ICD-10-CM | POA: Diagnosis not present

## 2023-10-18 DIAGNOSIS — M545 Low back pain, unspecified: Secondary | ICD-10-CM | POA: Diagnosis not present

## 2023-10-18 DIAGNOSIS — R7301 Impaired fasting glucose: Secondary | ICD-10-CM | POA: Diagnosis not present

## 2023-10-18 DIAGNOSIS — K589 Irritable bowel syndrome without diarrhea: Secondary | ICD-10-CM | POA: Diagnosis not present

## 2023-10-18 DIAGNOSIS — I471 Supraventricular tachycardia, unspecified: Secondary | ICD-10-CM | POA: Diagnosis not present

## 2023-10-18 DIAGNOSIS — J309 Allergic rhinitis, unspecified: Secondary | ICD-10-CM | POA: Diagnosis not present

## 2023-10-18 DIAGNOSIS — W57XXXA Bitten or stung by nonvenomous insect and other nonvenomous arthropods, initial encounter: Secondary | ICD-10-CM | POA: Diagnosis not present

## 2023-10-18 DIAGNOSIS — E785 Hyperlipidemia, unspecified: Secondary | ICD-10-CM | POA: Diagnosis not present

## 2023-10-18 DIAGNOSIS — M8580 Other specified disorders of bone density and structure, unspecified site: Secondary | ICD-10-CM | POA: Diagnosis not present

## 2023-10-18 DIAGNOSIS — E559 Vitamin D deficiency, unspecified: Secondary | ICD-10-CM | POA: Diagnosis not present

## 2023-10-18 DIAGNOSIS — S40861A Insect bite (nonvenomous) of right upper arm, initial encounter: Secondary | ICD-10-CM | POA: Diagnosis not present

## 2023-10-23 DIAGNOSIS — F4010 Social phobia, unspecified: Secondary | ICD-10-CM | POA: Diagnosis not present

## 2023-10-23 DIAGNOSIS — F3342 Major depressive disorder, recurrent, in full remission: Secondary | ICD-10-CM | POA: Diagnosis not present

## 2023-11-04 ENCOUNTER — Other Ambulatory Visit: Payer: Self-pay | Admitting: Internal Medicine

## 2023-11-04 ENCOUNTER — Ambulatory Visit
Admission: RE | Admit: 2023-11-04 | Discharge: 2023-11-04 | Disposition: A | Discharge status: Home / Self Care | Payer: Medicare Other | Source: Ambulatory Visit | Attending: Internal Medicine | Admitting: Internal Medicine

## 2023-11-04 DIAGNOSIS — N6320 Unspecified lump in the left breast, unspecified quadrant: Secondary | ICD-10-CM

## 2023-11-04 DIAGNOSIS — N6325 Unspecified lump in the left breast, overlapping quadrants: Secondary | ICD-10-CM | POA: Diagnosis not present

## 2023-11-04 DIAGNOSIS — R928 Other abnormal and inconclusive findings on diagnostic imaging of breast: Secondary | ICD-10-CM

## 2023-11-07 ENCOUNTER — Encounter: Payer: Self-pay | Admitting: Internal Medicine

## 2023-11-07 DIAGNOSIS — J3081 Allergic rhinitis due to animal (cat) (dog) hair and dander: Secondary | ICD-10-CM | POA: Diagnosis not present

## 2023-11-07 DIAGNOSIS — J301 Allergic rhinitis due to pollen: Secondary | ICD-10-CM | POA: Diagnosis not present

## 2023-11-07 DIAGNOSIS — J3089 Other allergic rhinitis: Secondary | ICD-10-CM | POA: Diagnosis not present

## 2023-11-12 DIAGNOSIS — J301 Allergic rhinitis due to pollen: Secondary | ICD-10-CM | POA: Diagnosis not present

## 2023-11-12 DIAGNOSIS — J3081 Allergic rhinitis due to animal (cat) (dog) hair and dander: Secondary | ICD-10-CM | POA: Diagnosis not present

## 2023-11-12 DIAGNOSIS — J3089 Other allergic rhinitis: Secondary | ICD-10-CM | POA: Diagnosis not present

## 2023-11-26 DIAGNOSIS — J3089 Other allergic rhinitis: Secondary | ICD-10-CM | POA: Diagnosis not present

## 2023-11-26 DIAGNOSIS — J3081 Allergic rhinitis due to animal (cat) (dog) hair and dander: Secondary | ICD-10-CM | POA: Diagnosis not present

## 2023-11-26 DIAGNOSIS — J301 Allergic rhinitis due to pollen: Secondary | ICD-10-CM | POA: Diagnosis not present

## 2023-12-16 DIAGNOSIS — J301 Allergic rhinitis due to pollen: Secondary | ICD-10-CM | POA: Diagnosis not present

## 2023-12-16 DIAGNOSIS — J3089 Other allergic rhinitis: Secondary | ICD-10-CM | POA: Diagnosis not present

## 2023-12-16 DIAGNOSIS — J3081 Allergic rhinitis due to animal (cat) (dog) hair and dander: Secondary | ICD-10-CM | POA: Diagnosis not present

## 2023-12-24 DIAGNOSIS — J3089 Other allergic rhinitis: Secondary | ICD-10-CM | POA: Diagnosis not present

## 2023-12-24 DIAGNOSIS — J301 Allergic rhinitis due to pollen: Secondary | ICD-10-CM | POA: Diagnosis not present

## 2023-12-24 DIAGNOSIS — J3081 Allergic rhinitis due to animal (cat) (dog) hair and dander: Secondary | ICD-10-CM | POA: Diagnosis not present

## 2023-12-26 DIAGNOSIS — Z808 Family history of malignant neoplasm of other organs or systems: Secondary | ICD-10-CM | POA: Diagnosis not present

## 2023-12-26 DIAGNOSIS — L821 Other seborrheic keratosis: Secondary | ICD-10-CM | POA: Diagnosis not present

## 2023-12-26 DIAGNOSIS — Z86018 Personal history of other benign neoplasm: Secondary | ICD-10-CM | POA: Diagnosis not present

## 2023-12-26 DIAGNOSIS — C44319 Basal cell carcinoma of skin of other parts of face: Secondary | ICD-10-CM | POA: Diagnosis not present

## 2023-12-26 DIAGNOSIS — D225 Melanocytic nevi of trunk: Secondary | ICD-10-CM | POA: Diagnosis not present

## 2023-12-26 DIAGNOSIS — Z85828 Personal history of other malignant neoplasm of skin: Secondary | ICD-10-CM | POA: Diagnosis not present

## 2023-12-26 DIAGNOSIS — D2271 Melanocytic nevi of right lower limb, including hip: Secondary | ICD-10-CM | POA: Diagnosis not present

## 2023-12-26 DIAGNOSIS — L820 Inflamed seborrheic keratosis: Secondary | ICD-10-CM | POA: Diagnosis not present

## 2023-12-26 DIAGNOSIS — L570 Actinic keratosis: Secondary | ICD-10-CM | POA: Diagnosis not present

## 2023-12-26 DIAGNOSIS — D2261 Melanocytic nevi of right upper limb, including shoulder: Secondary | ICD-10-CM | POA: Diagnosis not present

## 2023-12-26 DIAGNOSIS — D485 Neoplasm of uncertain behavior of skin: Secondary | ICD-10-CM | POA: Diagnosis not present

## 2023-12-26 DIAGNOSIS — L578 Other skin changes due to chronic exposure to nonionizing radiation: Secondary | ICD-10-CM | POA: Diagnosis not present

## 2024-01-10 DIAGNOSIS — J301 Allergic rhinitis due to pollen: Secondary | ICD-10-CM | POA: Diagnosis not present

## 2024-01-10 DIAGNOSIS — J3089 Other allergic rhinitis: Secondary | ICD-10-CM | POA: Diagnosis not present

## 2024-01-10 DIAGNOSIS — J3081 Allergic rhinitis due to animal (cat) (dog) hair and dander: Secondary | ICD-10-CM | POA: Diagnosis not present

## 2024-01-21 DIAGNOSIS — J301 Allergic rhinitis due to pollen: Secondary | ICD-10-CM | POA: Diagnosis not present

## 2024-01-21 DIAGNOSIS — J3081 Allergic rhinitis due to animal (cat) (dog) hair and dander: Secondary | ICD-10-CM | POA: Diagnosis not present

## 2024-01-21 DIAGNOSIS — J3089 Other allergic rhinitis: Secondary | ICD-10-CM | POA: Diagnosis not present

## 2024-02-06 DIAGNOSIS — C44319 Basal cell carcinoma of skin of other parts of face: Secondary | ICD-10-CM | POA: Diagnosis not present

## 2024-02-12 DIAGNOSIS — J3081 Allergic rhinitis due to animal (cat) (dog) hair and dander: Secondary | ICD-10-CM | POA: Diagnosis not present

## 2024-02-12 DIAGNOSIS — J301 Allergic rhinitis due to pollen: Secondary | ICD-10-CM | POA: Diagnosis not present

## 2024-02-12 DIAGNOSIS — J3089 Other allergic rhinitis: Secondary | ICD-10-CM | POA: Diagnosis not present

## 2024-02-18 DIAGNOSIS — J301 Allergic rhinitis due to pollen: Secondary | ICD-10-CM | POA: Diagnosis not present

## 2024-02-18 DIAGNOSIS — J3089 Other allergic rhinitis: Secondary | ICD-10-CM | POA: Diagnosis not present

## 2024-02-18 DIAGNOSIS — J3081 Allergic rhinitis due to animal (cat) (dog) hair and dander: Secondary | ICD-10-CM | POA: Diagnosis not present

## 2024-02-25 DIAGNOSIS — J3081 Allergic rhinitis due to animal (cat) (dog) hair and dander: Secondary | ICD-10-CM | POA: Diagnosis not present

## 2024-02-25 DIAGNOSIS — J3089 Other allergic rhinitis: Secondary | ICD-10-CM | POA: Diagnosis not present

## 2024-02-25 DIAGNOSIS — J301 Allergic rhinitis due to pollen: Secondary | ICD-10-CM | POA: Diagnosis not present

## 2024-03-03 DIAGNOSIS — J3089 Other allergic rhinitis: Secondary | ICD-10-CM | POA: Diagnosis not present

## 2024-03-03 DIAGNOSIS — J301 Allergic rhinitis due to pollen: Secondary | ICD-10-CM | POA: Diagnosis not present

## 2024-03-03 DIAGNOSIS — J3081 Allergic rhinitis due to animal (cat) (dog) hair and dander: Secondary | ICD-10-CM | POA: Diagnosis not present

## 2024-03-24 DIAGNOSIS — J3089 Other allergic rhinitis: Secondary | ICD-10-CM | POA: Diagnosis not present

## 2024-03-24 DIAGNOSIS — J3081 Allergic rhinitis due to animal (cat) (dog) hair and dander: Secondary | ICD-10-CM | POA: Diagnosis not present

## 2024-03-24 DIAGNOSIS — J301 Allergic rhinitis due to pollen: Secondary | ICD-10-CM | POA: Diagnosis not present

## 2024-03-31 DIAGNOSIS — N390 Urinary tract infection, site not specified: Secondary | ICD-10-CM | POA: Diagnosis not present

## 2024-04-08 DIAGNOSIS — K5900 Constipation, unspecified: Secondary | ICD-10-CM | POA: Diagnosis not present

## 2024-04-08 DIAGNOSIS — E559 Vitamin D deficiency, unspecified: Secondary | ICD-10-CM | POA: Diagnosis not present

## 2024-04-08 DIAGNOSIS — K219 Gastro-esophageal reflux disease without esophagitis: Secondary | ICD-10-CM | POA: Diagnosis not present

## 2024-04-08 DIAGNOSIS — E785 Hyperlipidemia, unspecified: Secondary | ICD-10-CM | POA: Diagnosis not present

## 2024-04-08 DIAGNOSIS — R7301 Impaired fasting glucose: Secondary | ICD-10-CM | POA: Diagnosis not present

## 2024-04-08 DIAGNOSIS — Z1212 Encounter for screening for malignant neoplasm of rectum: Secondary | ICD-10-CM | POA: Diagnosis not present

## 2024-04-11 DIAGNOSIS — R319 Hematuria, unspecified: Secondary | ICD-10-CM | POA: Diagnosis not present

## 2024-04-11 DIAGNOSIS — R3129 Other microscopic hematuria: Secondary | ICD-10-CM | POA: Diagnosis not present

## 2024-04-11 DIAGNOSIS — R058 Other specified cough: Secondary | ICD-10-CM | POA: Diagnosis not present

## 2024-04-11 DIAGNOSIS — R300 Dysuria: Secondary | ICD-10-CM | POA: Diagnosis not present

## 2024-04-11 DIAGNOSIS — R0981 Nasal congestion: Secondary | ICD-10-CM | POA: Diagnosis not present

## 2024-04-11 DIAGNOSIS — Z20822 Contact with and (suspected) exposure to covid-19: Secondary | ICD-10-CM | POA: Diagnosis not present

## 2024-04-13 DIAGNOSIS — M8580 Other specified disorders of bone density and structure, unspecified site: Secondary | ICD-10-CM | POA: Diagnosis not present

## 2024-04-13 DIAGNOSIS — I471 Supraventricular tachycardia, unspecified: Secondary | ICD-10-CM | POA: Diagnosis not present

## 2024-04-13 DIAGNOSIS — J309 Allergic rhinitis, unspecified: Secondary | ICD-10-CM | POA: Diagnosis not present

## 2024-04-13 DIAGNOSIS — F329 Major depressive disorder, single episode, unspecified: Secondary | ICD-10-CM | POA: Diagnosis not present

## 2024-04-13 DIAGNOSIS — F418 Other specified anxiety disorders: Secondary | ICD-10-CM | POA: Diagnosis not present

## 2024-04-13 DIAGNOSIS — Z0000 Encounter for general adult medical examination without abnormal findings: Secondary | ICD-10-CM | POA: Diagnosis not present

## 2024-04-13 DIAGNOSIS — H6123 Impacted cerumen, bilateral: Secondary | ICD-10-CM | POA: Diagnosis not present

## 2024-04-13 DIAGNOSIS — Z85828 Personal history of other malignant neoplasm of skin: Secondary | ICD-10-CM | POA: Diagnosis not present

## 2024-04-13 DIAGNOSIS — J329 Chronic sinusitis, unspecified: Secondary | ICD-10-CM | POA: Diagnosis not present

## 2024-04-13 DIAGNOSIS — D126 Benign neoplasm of colon, unspecified: Secondary | ICD-10-CM | POA: Diagnosis not present

## 2024-04-13 DIAGNOSIS — E785 Hyperlipidemia, unspecified: Secondary | ICD-10-CM | POA: Diagnosis not present

## 2024-04-13 DIAGNOSIS — K589 Irritable bowel syndrome without diarrhea: Secondary | ICD-10-CM | POA: Diagnosis not present

## 2024-04-16 DIAGNOSIS — L910 Hypertrophic scar: Secondary | ICD-10-CM | POA: Diagnosis not present

## 2024-04-23 DIAGNOSIS — J3081 Allergic rhinitis due to animal (cat) (dog) hair and dander: Secondary | ICD-10-CM | POA: Diagnosis not present

## 2024-04-23 DIAGNOSIS — J301 Allergic rhinitis due to pollen: Secondary | ICD-10-CM | POA: Diagnosis not present

## 2024-04-23 DIAGNOSIS — J3089 Other allergic rhinitis: Secondary | ICD-10-CM | POA: Diagnosis not present

## 2024-04-27 ENCOUNTER — Encounter (HOSPITAL_COMMUNITY): Payer: Self-pay

## 2024-04-28 DIAGNOSIS — J3081 Allergic rhinitis due to animal (cat) (dog) hair and dander: Secondary | ICD-10-CM | POA: Diagnosis not present

## 2024-04-28 DIAGNOSIS — J301 Allergic rhinitis due to pollen: Secondary | ICD-10-CM | POA: Diagnosis not present

## 2024-04-28 DIAGNOSIS — J3089 Other allergic rhinitis: Secondary | ICD-10-CM | POA: Diagnosis not present

## 2024-05-04 ENCOUNTER — Ambulatory Visit
Admission: RE | Admit: 2024-05-04 | Discharge: 2024-05-04 | Disposition: A | Discharge status: Home / Self Care | Payer: Medicare Other | Source: Ambulatory Visit | Attending: Internal Medicine | Admitting: Internal Medicine

## 2024-05-04 DIAGNOSIS — N6325 Unspecified lump in the left breast, overlapping quadrants: Secondary | ICD-10-CM | POA: Diagnosis not present

## 2024-05-04 DIAGNOSIS — N6320 Unspecified lump in the left breast, unspecified quadrant: Secondary | ICD-10-CM

## 2024-05-07 DIAGNOSIS — M545 Low back pain, unspecified: Secondary | ICD-10-CM | POA: Diagnosis not present

## 2024-05-07 DIAGNOSIS — K5900 Constipation, unspecified: Secondary | ICD-10-CM | POA: Diagnosis not present

## 2024-05-07 DIAGNOSIS — R319 Hematuria, unspecified: Secondary | ICD-10-CM | POA: Diagnosis not present

## 2024-05-07 DIAGNOSIS — I471 Supraventricular tachycardia, unspecified: Secondary | ICD-10-CM | POA: Diagnosis not present

## 2024-05-07 DIAGNOSIS — R102 Pelvic and perineal pain: Secondary | ICD-10-CM | POA: Diagnosis not present

## 2024-05-12 DIAGNOSIS — J3089 Other allergic rhinitis: Secondary | ICD-10-CM | POA: Diagnosis not present

## 2024-05-12 DIAGNOSIS — J301 Allergic rhinitis due to pollen: Secondary | ICD-10-CM | POA: Diagnosis not present

## 2024-05-12 DIAGNOSIS — J3081 Allergic rhinitis due to animal (cat) (dog) hair and dander: Secondary | ICD-10-CM | POA: Diagnosis not present

## 2024-05-18 DIAGNOSIS — J3081 Allergic rhinitis due to animal (cat) (dog) hair and dander: Secondary | ICD-10-CM | POA: Diagnosis not present

## 2024-05-18 DIAGNOSIS — J3089 Other allergic rhinitis: Secondary | ICD-10-CM | POA: Diagnosis not present

## 2024-05-18 DIAGNOSIS — J301 Allergic rhinitis due to pollen: Secondary | ICD-10-CM | POA: Diagnosis not present

## 2024-05-18 DIAGNOSIS — K219 Gastro-esophageal reflux disease without esophagitis: Secondary | ICD-10-CM | POA: Diagnosis not present

## 2024-05-25 DIAGNOSIS — M8589 Other specified disorders of bone density and structure, multiple sites: Secondary | ICD-10-CM | POA: Diagnosis not present

## 2024-05-26 DIAGNOSIS — J301 Allergic rhinitis due to pollen: Secondary | ICD-10-CM | POA: Diagnosis not present

## 2024-05-26 DIAGNOSIS — J3089 Other allergic rhinitis: Secondary | ICD-10-CM | POA: Diagnosis not present

## 2024-05-26 DIAGNOSIS — J3081 Allergic rhinitis due to animal (cat) (dog) hair and dander: Secondary | ICD-10-CM | POA: Diagnosis not present

## 2024-06-02 DIAGNOSIS — J301 Allergic rhinitis due to pollen: Secondary | ICD-10-CM | POA: Diagnosis not present

## 2024-06-02 DIAGNOSIS — J3089 Other allergic rhinitis: Secondary | ICD-10-CM | POA: Diagnosis not present

## 2024-06-02 DIAGNOSIS — J3081 Allergic rhinitis due to animal (cat) (dog) hair and dander: Secondary | ICD-10-CM | POA: Diagnosis not present

## 2024-06-09 DIAGNOSIS — J3081 Allergic rhinitis due to animal (cat) (dog) hair and dander: Secondary | ICD-10-CM | POA: Diagnosis not present

## 2024-06-09 DIAGNOSIS — J301 Allergic rhinitis due to pollen: Secondary | ICD-10-CM | POA: Diagnosis not present

## 2024-06-09 DIAGNOSIS — J3089 Other allergic rhinitis: Secondary | ICD-10-CM | POA: Diagnosis not present

## 2024-06-22 DIAGNOSIS — J3089 Other allergic rhinitis: Secondary | ICD-10-CM | POA: Diagnosis not present

## 2024-06-22 DIAGNOSIS — J3081 Allergic rhinitis due to animal (cat) (dog) hair and dander: Secondary | ICD-10-CM | POA: Diagnosis not present

## 2024-06-22 DIAGNOSIS — J301 Allergic rhinitis due to pollen: Secondary | ICD-10-CM | POA: Diagnosis not present

## 2024-07-07 DIAGNOSIS — J3089 Other allergic rhinitis: Secondary | ICD-10-CM | POA: Diagnosis not present

## 2024-07-07 DIAGNOSIS — J301 Allergic rhinitis due to pollen: Secondary | ICD-10-CM | POA: Diagnosis not present

## 2024-07-07 DIAGNOSIS — J3081 Allergic rhinitis due to animal (cat) (dog) hair and dander: Secondary | ICD-10-CM | POA: Diagnosis not present

## 2024-07-10 DIAGNOSIS — F439 Reaction to severe stress, unspecified: Secondary | ICD-10-CM | POA: Diagnosis not present

## 2024-07-10 DIAGNOSIS — F4010 Social phobia, unspecified: Secondary | ICD-10-CM | POA: Diagnosis not present

## 2024-07-20 DIAGNOSIS — F439 Reaction to severe stress, unspecified: Secondary | ICD-10-CM | POA: Diagnosis not present

## 2024-07-20 DIAGNOSIS — F4010 Social phobia, unspecified: Secondary | ICD-10-CM | POA: Diagnosis not present

## 2024-07-22 DIAGNOSIS — J3089 Other allergic rhinitis: Secondary | ICD-10-CM | POA: Diagnosis not present

## 2024-07-22 DIAGNOSIS — J301 Allergic rhinitis due to pollen: Secondary | ICD-10-CM | POA: Diagnosis not present

## 2024-07-22 DIAGNOSIS — J3081 Allergic rhinitis due to animal (cat) (dog) hair and dander: Secondary | ICD-10-CM | POA: Diagnosis not present

## 2024-07-31 DIAGNOSIS — E559 Vitamin D deficiency, unspecified: Secondary | ICD-10-CM | POA: Diagnosis not present

## 2024-07-31 DIAGNOSIS — M79671 Pain in right foot: Secondary | ICD-10-CM | POA: Diagnosis not present

## 2024-07-31 DIAGNOSIS — M8580 Other specified disorders of bone density and structure, unspecified site: Secondary | ICD-10-CM | POA: Diagnosis not present

## 2024-07-31 DIAGNOSIS — M19071 Primary osteoarthritis, right ankle and foot: Secondary | ICD-10-CM | POA: Diagnosis not present

## 2024-08-04 DIAGNOSIS — F439 Reaction to severe stress, unspecified: Secondary | ICD-10-CM | POA: Diagnosis not present

## 2024-08-04 DIAGNOSIS — J301 Allergic rhinitis due to pollen: Secondary | ICD-10-CM | POA: Diagnosis not present

## 2024-08-04 DIAGNOSIS — F4010 Social phobia, unspecified: Secondary | ICD-10-CM | POA: Diagnosis not present

## 2024-08-04 DIAGNOSIS — J3089 Other allergic rhinitis: Secondary | ICD-10-CM | POA: Diagnosis not present

## 2024-08-04 DIAGNOSIS — J3081 Allergic rhinitis due to animal (cat) (dog) hair and dander: Secondary | ICD-10-CM | POA: Diagnosis not present

## 2024-08-12 DIAGNOSIS — H40013 Open angle with borderline findings, low risk, bilateral: Secondary | ICD-10-CM | POA: Diagnosis not present

## 2024-08-12 DIAGNOSIS — H26493 Other secondary cataract, bilateral: Secondary | ICD-10-CM | POA: Diagnosis not present

## 2024-08-12 DIAGNOSIS — H52203 Unspecified astigmatism, bilateral: Secondary | ICD-10-CM | POA: Diagnosis not present

## 2024-08-12 DIAGNOSIS — H43813 Vitreous degeneration, bilateral: Secondary | ICD-10-CM | POA: Diagnosis not present

## 2024-08-12 DIAGNOSIS — H04123 Dry eye syndrome of bilateral lacrimal glands: Secondary | ICD-10-CM | POA: Diagnosis not present

## 2024-08-12 DIAGNOSIS — H524 Presbyopia: Secondary | ICD-10-CM | POA: Diagnosis not present

## 2024-08-18 DIAGNOSIS — F4010 Social phobia, unspecified: Secondary | ICD-10-CM | POA: Diagnosis not present

## 2024-08-18 DIAGNOSIS — J3089 Other allergic rhinitis: Secondary | ICD-10-CM | POA: Diagnosis not present

## 2024-08-18 DIAGNOSIS — F439 Reaction to severe stress, unspecified: Secondary | ICD-10-CM | POA: Diagnosis not present

## 2024-08-18 DIAGNOSIS — J301 Allergic rhinitis due to pollen: Secondary | ICD-10-CM | POA: Diagnosis not present

## 2024-08-18 DIAGNOSIS — J3081 Allergic rhinitis due to animal (cat) (dog) hair and dander: Secondary | ICD-10-CM | POA: Diagnosis not present

## 2024-09-01 DIAGNOSIS — F439 Reaction to severe stress, unspecified: Secondary | ICD-10-CM | POA: Diagnosis not present

## 2024-09-01 DIAGNOSIS — J3081 Allergic rhinitis due to animal (cat) (dog) hair and dander: Secondary | ICD-10-CM | POA: Diagnosis not present

## 2024-09-01 DIAGNOSIS — J3089 Other allergic rhinitis: Secondary | ICD-10-CM | POA: Diagnosis not present

## 2024-09-01 DIAGNOSIS — J301 Allergic rhinitis due to pollen: Secondary | ICD-10-CM | POA: Diagnosis not present

## 2024-09-01 DIAGNOSIS — F4010 Social phobia, unspecified: Secondary | ICD-10-CM | POA: Diagnosis not present

## 2024-09-08 DIAGNOSIS — J301 Allergic rhinitis due to pollen: Secondary | ICD-10-CM | POA: Diagnosis not present

## 2024-09-08 DIAGNOSIS — J3081 Allergic rhinitis due to animal (cat) (dog) hair and dander: Secondary | ICD-10-CM | POA: Diagnosis not present

## 2024-09-08 DIAGNOSIS — J3089 Other allergic rhinitis: Secondary | ICD-10-CM | POA: Diagnosis not present

## 2024-09-10 DIAGNOSIS — Z23 Encounter for immunization: Secondary | ICD-10-CM | POA: Diagnosis not present

## 2024-09-10 DIAGNOSIS — M6289 Other specified disorders of muscle: Secondary | ICD-10-CM | POA: Diagnosis not present

## 2024-09-10 DIAGNOSIS — R102 Pelvic and perineal pain: Secondary | ICD-10-CM | POA: Diagnosis not present

## 2024-09-10 DIAGNOSIS — N952 Postmenopausal atrophic vaginitis: Secondary | ICD-10-CM | POA: Diagnosis not present

## 2024-09-10 DIAGNOSIS — N8111 Cystocele, midline: Secondary | ICD-10-CM | POA: Diagnosis not present

## 2024-09-15 DIAGNOSIS — J3089 Other allergic rhinitis: Secondary | ICD-10-CM | POA: Diagnosis not present

## 2024-09-15 DIAGNOSIS — J3081 Allergic rhinitis due to animal (cat) (dog) hair and dander: Secondary | ICD-10-CM | POA: Diagnosis not present

## 2024-09-15 DIAGNOSIS — J301 Allergic rhinitis due to pollen: Secondary | ICD-10-CM | POA: Diagnosis not present

## 2024-09-17 DIAGNOSIS — F4010 Social phobia, unspecified: Secondary | ICD-10-CM | POA: Diagnosis not present

## 2024-09-17 DIAGNOSIS — F439 Reaction to severe stress, unspecified: Secondary | ICD-10-CM | POA: Diagnosis not present

## 2024-09-29 DIAGNOSIS — F439 Reaction to severe stress, unspecified: Secondary | ICD-10-CM | POA: Diagnosis not present

## 2024-09-29 DIAGNOSIS — J3089 Other allergic rhinitis: Secondary | ICD-10-CM | POA: Diagnosis not present

## 2024-09-29 DIAGNOSIS — J3081 Allergic rhinitis due to animal (cat) (dog) hair and dander: Secondary | ICD-10-CM | POA: Diagnosis not present

## 2024-09-29 DIAGNOSIS — F4010 Social phobia, unspecified: Secondary | ICD-10-CM | POA: Diagnosis not present

## 2024-09-29 DIAGNOSIS — J301 Allergic rhinitis due to pollen: Secondary | ICD-10-CM | POA: Diagnosis not present

## 2024-10-13 DIAGNOSIS — J301 Allergic rhinitis due to pollen: Secondary | ICD-10-CM | POA: Diagnosis not present

## 2024-10-13 DIAGNOSIS — J3081 Allergic rhinitis due to animal (cat) (dog) hair and dander: Secondary | ICD-10-CM | POA: Diagnosis not present

## 2024-10-13 DIAGNOSIS — J3089 Other allergic rhinitis: Secondary | ICD-10-CM | POA: Diagnosis not present

## 2024-10-16 DIAGNOSIS — F4010 Social phobia, unspecified: Secondary | ICD-10-CM | POA: Diagnosis not present

## 2024-10-16 DIAGNOSIS — F439 Reaction to severe stress, unspecified: Secondary | ICD-10-CM | POA: Diagnosis not present

## 2024-10-21 DIAGNOSIS — J3081 Allergic rhinitis due to animal (cat) (dog) hair and dander: Secondary | ICD-10-CM | POA: Diagnosis not present

## 2024-10-21 DIAGNOSIS — J3089 Other allergic rhinitis: Secondary | ICD-10-CM | POA: Diagnosis not present

## 2024-10-21 DIAGNOSIS — J301 Allergic rhinitis due to pollen: Secondary | ICD-10-CM | POA: Diagnosis not present

## 2024-10-27 DIAGNOSIS — F439 Reaction to severe stress, unspecified: Secondary | ICD-10-CM | POA: Diagnosis not present

## 2024-10-27 DIAGNOSIS — F4010 Social phobia, unspecified: Secondary | ICD-10-CM | POA: Diagnosis not present

## 2024-10-28 DIAGNOSIS — J301 Allergic rhinitis due to pollen: Secondary | ICD-10-CM | POA: Diagnosis not present

## 2024-10-28 DIAGNOSIS — J3081 Allergic rhinitis due to animal (cat) (dog) hair and dander: Secondary | ICD-10-CM | POA: Diagnosis not present

## 2024-10-28 DIAGNOSIS — J3089 Other allergic rhinitis: Secondary | ICD-10-CM | POA: Diagnosis not present

## 2024-11-05 DIAGNOSIS — J3089 Other allergic rhinitis: Secondary | ICD-10-CM | POA: Diagnosis not present

## 2024-11-05 DIAGNOSIS — J301 Allergic rhinitis due to pollen: Secondary | ICD-10-CM | POA: Diagnosis not present

## 2024-11-05 DIAGNOSIS — J3081 Allergic rhinitis due to animal (cat) (dog) hair and dander: Secondary | ICD-10-CM | POA: Diagnosis not present

## 2024-11-19 DIAGNOSIS — F439 Reaction to severe stress, unspecified: Secondary | ICD-10-CM | POA: Diagnosis not present

## 2024-11-19 DIAGNOSIS — F4010 Social phobia, unspecified: Secondary | ICD-10-CM | POA: Diagnosis not present

## 2024-11-24 DIAGNOSIS — J3089 Other allergic rhinitis: Secondary | ICD-10-CM | POA: Diagnosis not present

## 2024-11-24 DIAGNOSIS — J301 Allergic rhinitis due to pollen: Secondary | ICD-10-CM | POA: Diagnosis not present

## 2024-11-24 DIAGNOSIS — J3081 Allergic rhinitis due to animal (cat) (dog) hair and dander: Secondary | ICD-10-CM | POA: Diagnosis not present

## 2024-12-03 DIAGNOSIS — F4010 Social phobia, unspecified: Secondary | ICD-10-CM | POA: Diagnosis not present

## 2024-12-03 DIAGNOSIS — F439 Reaction to severe stress, unspecified: Secondary | ICD-10-CM | POA: Diagnosis not present

## 2025-01-07 ENCOUNTER — Ambulatory Visit (HOSPITAL_COMMUNITY)
Admission: RE | Admit: 2025-01-07 | Discharge: 2025-01-07 | Disposition: A | Discharge status: Home / Self Care | Source: Ambulatory Visit | Attending: Nurse Practitioner | Admitting: Nurse Practitioner

## 2025-01-07 ENCOUNTER — Encounter (HOSPITAL_COMMUNITY): Payer: Self-pay | Admitting: Nurse Practitioner

## 2025-01-07 VITALS — BP 112/90 | HR 78 | Ht 66.0 in | Wt 177.8 lb

## 2025-01-07 DIAGNOSIS — I471 Supraventricular tachycardia, unspecified: Secondary | ICD-10-CM | POA: Diagnosis not present

## 2025-01-07 DIAGNOSIS — I4891 Unspecified atrial fibrillation: Secondary | ICD-10-CM | POA: Diagnosis present

## 2025-01-07 NOTE — Progress Notes (Addendum)
 "  Primary Care Physician: Shayne Anes, MD Primary Cardiologist: None Electrophysiologist: None  Referring Physician: Shayne Anes, MD  Alicia [name] is a 79 y.o. female with a history of  HLD, GERD PSVT, OA, depression who presents for follow up in the [facility]. Alicia [name] was seen by her PCP and reported episodes of atrial fibrillation on her smart watch. She has a past medical history of paroxysmal SVT that occurred in 2002 were treated with adenosine and controlled with verapamil. She was previously followed by Dr. Debera and last seen in 2009 and previously completed an exercise Cardiolite that showed no ischemia.  2D echo at that time showed normal EF of 55-50% with no major valvular abnormalities.  AliciaLung presents today with her daughter for follow-up in the A-fib clinic.  On examination today patient is in sinus rhythm with frequent PACs.  She reports doing well over the past few months and denies any episodes of her heart racing since being on verapamil.  She does note 2 episodes of irregular heart rate notification made by her Apple Watch. She was unable to pull up her rhythm strips during today's visit.  During today's visit we discussed the pathophysiology of atrial fibrillation and briefly reviewed treatments such as rate control, rhythm control as well as anticoagulation. We discussed pursuing monitoring with a Zio patch for definitive evaluation of her heart rate.  She had all of her questions answered today and was in agreement to pursue this plan.  Today, she denies symptoms of palpitations, chest pain, shortness of breath, orthopnea, PND, lower extremity edema, dizziness, presyncope, syncope, snoring, daytime somnolence, bleeding, or neurologic sequela. The patient is tolerating medications without difficulties and is otherwise without complaint today.   Discussed the use of AI scribe software for clinical note transcription with the patient, who gave  verbal consent to proceed.   Atrial Fibrillation Management history: History of Sleep Apnea History of alcohol  use Previous antiarrhythmic drugs: None Previous cardioversions: None Previous ablations: None Anticoagulation history: None  ROS- All systems are reviewed and negative except as per the HPI above.  Past Medical History:  Diagnosis Date   Abnormal LFTs    Allergy    SEASONAL   Anxiety    Arthritis    Cancer (HCC)    SKIN   Cataract    REMOVED   Colon polyp    adenomatous   Depression    Environmental allergies    GERD (gastroesophageal reflux disease)    Hx of skin cancer, basal cell 03/2016   Hyperlipidemia    IBS (irritable bowel syndrome)    IFG (impaired fasting glucose)    Osteopenia    SVT (supraventricular tachycardia)    Vitamin D deficiency    Past Surgical History:  Procedure Laterality Date   basal cell removal     cataract removal     bilateral   COLONOSCOPY  2002   DILATION AND CURETTAGE OF UTERUS     MOHS SURGERY     rt side of nose   MULTIPLE TOOTH EXTRACTIONS  2012   Patient has no known allergies. Current Outpatient Medications  Medication Sig Dispense Refill   conjugated estrogens (PREMARIN) vaginal cream Place 1 applicator vaginally daily.     CRESTOR 20 MG tablet Take 10 mg by mouth daily.      EPINEPHrine 0.3 mg/0.3 mL IJ SOAJ injection Inject 0.3 mg into the muscle as needed for anaphylaxis.     Fexofenadine HCl (ALLEGRA PO)  Take by mouth daily.     fluticasone (FLONASE) 50 MCG/ACT nasal spray Place 2 sprays into the nose daily.      Multiple Vitamin (MULTIVITAMIN) tablet Take 1 tablet by mouth daily.     OVER THE COUNTER MEDICATION CVS Fiber 2 capsules daily.     OVER THE COUNTER MEDICATION Magnesium 500 mg, one capsule daily.     pantoprazole (PROTONIX) 40 MG tablet Take 40 mg by mouth daily.     PARoxetine (PAXIL) 10 MG tablet Take 10 mg by mouth daily.      verapamil (CALAN-SR) 180 MG CR tablet Take 180 mg by mouth daily.       No current facility-administered medications for this encounter.    Physical Exam: BP (!) 112/90   Pulse 78   Ht 5' 6 (1.676 m)   Wt 80.6 kg   BMI 28.70 kg/m   GEN: Well nourished, well developed in no acute distress NECK: No JVD; No carotid bruits CARDIAC: Regular rate and rhythm, no murmurs, rubs, gallops RESPIRATORY:  Clear to auscultation without rales, wheezing or rhonchi  ABDOMEN: Soft, non-tender, non-distended EXTREMITIES:  No edema; No deformity   Wt Readings from Last 3 Encounters:  01/07/25 80.6 kg  03/01/23 78.9 kg  09/08/19 78 kg    No results found for: CBC, TSH EKG today demonstrates:   EKG Interpretation Date/Time:  Thursday January 07 2025 15:49:23 EST Ventricular Rate:  78 PR Interval:  140 QRS Duration:  94 QT Interval:  432 QTC Calculation: 492 R Axis:   6  Text Interpretation: Sinus rhythm with occasional Premature ventricular complexes Nonspecific ST abnormality Prolonged QT Abnormal ECG No previous ECGs available Confirmed by Wyn Manus [id number]) on 01/07/2025 3:54:11 PM       Echo Completed results in Care Everywhere  ASSESSMENT AND PLAN: PSVT: -Previous diagnosis of SVT with recent alerts on Apple Watch showing A-fib however EKG at PCP office as well as clinic shows sinus with PACs. - We reviewed pathophysiology of atrial fibrillation and also reviewed various treatment such as rate control, rhythm control and anticoagulant -Patient will wear 7-day ZIO to evaluate heart rate and to rule out atrial fibrillation. -She is in agreement to start anticoagulant if she does have a definitive diagnosis of atrial fibrillation.  Signed,  Wyn Raddle, Manus Shove, NP    01/07/2025 4:29 PM    Follow up with the AF Clinic in 1 month  "

## 2025-01-28 ENCOUNTER — Ambulatory Visit (HOSPITAL_COMMUNITY): Admitting: Nurse Practitioner
